# Patient Record
Sex: Female | Born: 1985 | Race: Black or African American | Hispanic: No | Marital: Single | Smoking: Former smoker
Health system: Southern US, Community
[De-identification: ages and names within clinical notes are randomized; demographics above are authoritative.]

## PROBLEM LIST (undated history)

## (undated) DIAGNOSIS — C50919 Malignant neoplasm of unspecified site of unspecified female breast: Secondary | ICD-10-CM

## (undated) DIAGNOSIS — G43909 Migraine, unspecified, not intractable, without status migrainosus: Secondary | ICD-10-CM

---

## 2010-06-09 ENCOUNTER — Emergency Department (HOSPITAL_COMMUNITY): Admission: EM | Admit: 2010-06-09 | Discharge: 2010-06-09 | Payer: Self-pay | Admitting: Emergency Medicine

## 2010-06-18 ENCOUNTER — Emergency Department (HOSPITAL_COMMUNITY): Admission: EM | Admit: 2010-06-18 | Discharge: 2010-06-18 | Payer: Self-pay | Admitting: Emergency Medicine

## 2010-06-21 ENCOUNTER — Emergency Department (HOSPITAL_COMMUNITY): Admission: EM | Admit: 2010-06-21 | Discharge: 2010-06-21 | Payer: Self-pay | Admitting: Emergency Medicine

## 2010-12-17 ENCOUNTER — Emergency Department (HOSPITAL_COMMUNITY)
Admission: EM | Admit: 2010-12-17 | Discharge: 2010-12-17 | Disposition: A | Discharge status: Home / Self Care | Payer: Self-pay | Attending: Emergency Medicine | Admitting: Emergency Medicine

## 2010-12-17 DIAGNOSIS — M545 Low back pain, unspecified: Secondary | ICD-10-CM | POA: Insufficient documentation

## 2010-12-17 DIAGNOSIS — N390 Urinary tract infection, site not specified: Secondary | ICD-10-CM | POA: Insufficient documentation

## 2010-12-17 DIAGNOSIS — N72 Inflammatory disease of cervix uteri: Secondary | ICD-10-CM | POA: Insufficient documentation

## 2010-12-17 DIAGNOSIS — Z853 Personal history of malignant neoplasm of breast: Secondary | ICD-10-CM | POA: Insufficient documentation

## 2010-12-17 DIAGNOSIS — R109 Unspecified abdominal pain: Secondary | ICD-10-CM | POA: Insufficient documentation

## 2010-12-17 DIAGNOSIS — N898 Other specified noninflammatory disorders of vagina: Secondary | ICD-10-CM | POA: Insufficient documentation

## 2010-12-17 DIAGNOSIS — A5901 Trichomonal vulvovaginitis: Secondary | ICD-10-CM | POA: Insufficient documentation

## 2010-12-17 LAB — WET PREP, GENITAL: Clue Cells Wet Prep HPF POC: NONE SEEN

## 2010-12-17 LAB — URINE MICROSCOPIC-ADD ON

## 2010-12-17 LAB — URINALYSIS, ROUTINE W REFLEX MICROSCOPIC
Bilirubin Urine: NEGATIVE
Glucose, UA: NEGATIVE mg/dL
Ketones, ur: NEGATIVE mg/dL
Protein, ur: NEGATIVE mg/dL

## 2013-06-03 ENCOUNTER — Encounter (HOSPITAL_COMMUNITY): Payer: Self-pay | Admitting: *Deleted

## 2013-06-03 ENCOUNTER — Emergency Department (HOSPITAL_COMMUNITY)
Admission: EM | Admit: 2013-06-03 | Discharge: 2013-06-03 | Disposition: A | Discharge status: Home / Self Care | Payer: Self-pay | Attending: Emergency Medicine | Admitting: Emergency Medicine

## 2013-06-03 DIAGNOSIS — R197 Diarrhea, unspecified: Secondary | ICD-10-CM | POA: Insufficient documentation

## 2013-06-03 DIAGNOSIS — F17200 Nicotine dependence, unspecified, uncomplicated: Secondary | ICD-10-CM | POA: Insufficient documentation

## 2013-06-03 DIAGNOSIS — N739 Female pelvic inflammatory disease, unspecified: Secondary | ICD-10-CM | POA: Insufficient documentation

## 2013-06-03 DIAGNOSIS — Z853 Personal history of malignant neoplasm of breast: Secondary | ICD-10-CM | POA: Insufficient documentation

## 2013-06-03 DIAGNOSIS — E869 Volume depletion, unspecified: Secondary | ICD-10-CM | POA: Insufficient documentation

## 2013-06-03 DIAGNOSIS — Z79899 Other long term (current) drug therapy: Secondary | ICD-10-CM | POA: Insufficient documentation

## 2013-06-03 DIAGNOSIS — N390 Urinary tract infection, site not specified: Secondary | ICD-10-CM | POA: Insufficient documentation

## 2013-06-03 DIAGNOSIS — Z3202 Encounter for pregnancy test, result negative: Secondary | ICD-10-CM | POA: Insufficient documentation

## 2013-06-03 DIAGNOSIS — R6883 Chills (without fever): Secondary | ICD-10-CM | POA: Insufficient documentation

## 2013-06-03 DIAGNOSIS — A599 Trichomoniasis, unspecified: Secondary | ICD-10-CM | POA: Insufficient documentation

## 2013-06-03 DIAGNOSIS — R112 Nausea with vomiting, unspecified: Secondary | ICD-10-CM | POA: Insufficient documentation

## 2013-06-03 HISTORY — DX: Malignant neoplasm of unspecified site of unspecified female breast: C50.919

## 2013-06-03 LAB — POCT PREGNANCY, URINE: Preg Test, Ur: NEGATIVE

## 2013-06-03 LAB — CBC WITH DIFFERENTIAL/PLATELET
Basophils Relative: 0 % (ref 0–1)
HCT: 41.1 % (ref 36.0–46.0)
Hemoglobin: 15 g/dL (ref 12.0–15.0)
Lymphs Abs: 2.8 10*3/uL (ref 0.7–4.0)
MCHC: 36.5 g/dL — ABNORMAL HIGH (ref 30.0–36.0)
Monocytes Absolute: 0.7 10*3/uL (ref 0.1–1.0)
Monocytes Relative: 10 % (ref 3–12)
Neutro Abs: 3.1 10*3/uL (ref 1.7–7.7)
RBC: 4.37 MIL/uL (ref 3.87–5.11)

## 2013-06-03 LAB — LIPASE, BLOOD: Lipase: 41 U/L (ref 11–59)

## 2013-06-03 LAB — COMPREHENSIVE METABOLIC PANEL
Albumin: 4.7 g/dL (ref 3.5–5.2)
BUN: 8 mg/dL (ref 6–23)
CO2: 24 mEq/L (ref 19–32)
Chloride: 97 mEq/L (ref 96–112)
Creatinine, Ser: 0.72 mg/dL (ref 0.50–1.10)
GFR calc Af Amer: >90 mL/min (ref >90)
GFR calc non Af Amer: >90 mL/min (ref >90)
Glucose, Bld: 92 mg/dL (ref 70–99)
Total Bilirubin: 0.2 mg/dL — ABNORMAL LOW (ref 0.3–1.2)

## 2013-06-03 LAB — URINE MICROSCOPIC-ADD ON

## 2013-06-03 LAB — URINALYSIS, ROUTINE W REFLEX MICROSCOPIC
Bilirubin Urine: NEGATIVE
Glucose, UA: NEGATIVE mg/dL
Ketones, ur: NEGATIVE mg/dL
pH: 5.5 (ref 5.0–8.0)

## 2013-06-03 LAB — GC/CHLAMYDIA PROBE AMP: GC Probe RNA: NEGATIVE

## 2013-06-03 MED ORDER — LIDOCAINE HCL (PF) 1 % IJ SOLN
INTRAMUSCULAR | Status: AC
  Administered 2013-06-03: 2.1 mL
  Filled 2013-06-03: qty 5

## 2013-06-03 MED ORDER — CEFTRIAXONE SODIUM 1 G IJ SOLR
1.0000 g | Freq: Once | INTRAMUSCULAR | Status: AC
  Administered 2013-06-03: 1 g via INTRAMUSCULAR
  Filled 2013-06-03: qty 10

## 2013-06-03 MED ORDER — AZITHROMYCIN 1 G PO PACK: PACK | ORAL | Status: DC

## 2013-06-03 MED ORDER — AZITHROMYCIN 1 G PO PACK
1.0000 g | PACK | Freq: Once | ORAL | Status: AC
  Administered 2013-06-03: 1 g via ORAL
  Filled 2013-06-03: qty 1

## 2013-06-03 MED ORDER — FENTANYL CITRATE 0.05 MG/ML IJ SOLN
100.0000 ug | Freq: Once | INTRAMUSCULAR | Status: AC
  Administered 2013-06-03: 100 ug via INTRAVENOUS
  Filled 2013-06-03: qty 2

## 2013-06-03 MED ORDER — SODIUM CHLORIDE 0.9 % IV BOLUS (SEPSIS)
1000.0000 mL | Freq: Once | INTRAVENOUS | Status: AC
  Administered 2013-06-03: 1000 mL via INTRAVENOUS

## 2013-06-03 MED ORDER — METRONIDAZOLE 500 MG PO TABS
2000.0000 mg | ORAL_TABLET | Freq: Once | ORAL | Status: AC
  Administered 2013-06-03: 2000 mg via ORAL
  Filled 2013-06-03: qty 4

## 2013-06-03 MED ORDER — NITROFURANTOIN MONOHYD MACRO 100 MG PO CAPS: 100.0000 mg | ORAL_CAPSULE | Freq: Two times a day (BID) | ORAL | Status: DC

## 2013-06-03 MED ORDER — HYDROCODONE-ACETAMINOPHEN 5-325 MG PO TABS: 1.0000 | ORAL_TABLET | Freq: Four times a day (QID) | ORAL | Status: DC | PRN

## 2013-06-03 MED ORDER — ONDANSETRON HCL 4 MG/2ML IJ SOLN
4.0000 mg | Freq: Once | INTRAMUSCULAR | Status: AC
  Administered 2013-06-03: 4 mg via INTRAVENOUS
  Filled 2013-06-03: qty 2

## 2013-06-03 MED ORDER — ONDANSETRON 8 MG PO TBDP: 8.0000 mg | ORAL_TABLET | Freq: Three times a day (TID) | ORAL | Status: DC | PRN

## 2013-06-03 NOTE — ED Provider Notes (Addendum)
CSN: [id number]     Arrival date & time 06/03/13  0157 History   First MD Initiated Contact with Patient 06/03/13 0226     Chief Complaint  Patient presents with  . Abdominal Pain   (Consider location/radiation/quality/duration/timing/severity/associated sxs/prior Treatment) HPI This is a 27 year old female who has had nausea, vomiting and diarrhea for the past 5 days. The diarrhea is described as brown and watery but without blood or mucus. This has been accompanied by generalized abdominal pain which is of a burning nature. This pain is constant. The pain is worse with movement. She has not had a fever but has had chills. She denies urinary symptoms. She denies vaginal bleeding but does have a vaginal discharge. She states she missed her most recent menstrual period so could possibly be pregnant. Her abdomen is not distended. Her nausea and vomiting are made worse by attempting to eat.  Past Medical History  Diagnosis Date  . Breast cancer    History reviewed. No pertinent past surgical history. History reviewed. No pertinent family history. History  Substance Use Topics  . Smoking status: Current Every Day Smoker  . Smokeless tobacco: Not on file  . Alcohol Use: No   OB History   Grav Para Term Preterm Abortions TAB SAB Ect Mult Living                 Review of Systems  All other systems reviewed and are negative.    Allergies  Vancomycin  Home Medications   Current Outpatient Rx  Name  Route  Sig  Dispense  Refill  . fish oil-omega-3 fatty acids 1000 MG capsule   Oral   Take 1 g by mouth 2 (two) times daily.         . Multiple Vitamin (MULTIVITAMIN WITH MINERALS) TABS tablet   Oral   Take 1 tablet by mouth daily.          BP 121/80  Pulse 88  Temp(Src) 98.8 F (37.1 C) (Oral)  Resp 16  SpO2 100%  LMP 04/03/2013  Physical Exam General: Well-developed, well-nourished female in no acute distress; appearance consistent with age of record HENT:  normocephalic; atraumatic Eyes: pupils equal, round and reactive to light; extraocular muscles intact Neck: supple Heart: regular rate and rhythm; no murmurs, rubs or gallops Lungs: clear to auscultation bilaterally Abdomen: soft; nondistended; diffuse tenderness; no masses or hepatosplenomegaly; bowel sounds present GU: Bilateral CVA tenderness right greater than left; normal external genitalia; white vaginal discharge; no vaginal bleeding; cervical motion tenderness; bilateral adnexal tenderness Extremities: No deformity; full range of motion; pulses normal; no edema Neurologic: Awake, alert and oriented; motor function intact in all extremities and symmetric; no facial droop Skin: Warm and dry Psychiatric: Normal mood and affect    ED Course  Procedures (including critical care time)    MDM  Nursing notes and vitals signs, including pulse oximetry, reviewed.  Summary of this visit's results, reviewed by myself:  Labs:  Results for orders placed during the hospital encounter of 06/03/13 (from the past 24 hour(s))  URINALYSIS, ROUTINE W REFLEX MICROSCOPIC     Status: Abnormal   Collection Time    06/03/13  2:29 AM      Result Value Range   Color, Urine YELLOW  YELLOW   APPearance TURBID (*) CLEAR   Specific Gravity, Urine 1.038 (*) 1.005 - 1.030   pH 5.5  5.0 - 8.0   Glucose, UA NEGATIVE  NEGATIVE mg/dL   Hgb urine dipstick MODERATE (*)  NEGATIVE   Bilirubin Urine NEGATIVE  NEGATIVE   Ketones, ur NEGATIVE  NEGATIVE mg/dL   Protein, ur NEGATIVE  NEGATIVE mg/dL   Urobilinogen, UA 0.2  0.0 - 1.0 mg/dL   Nitrite NEGATIVE  NEGATIVE   Leukocytes, UA LARGE (*) NEGATIVE  URINE MICROSCOPIC-ADD ON     Status: Abnormal   Collection Time    06/03/13  2:29 AM      Result Value Range   Squamous Epithelial / LPF MANY (*) RARE   WBC, UA 21-50  <3 WBC/hpf   RBC / HPF 7-10  <3 RBC/hpf   Bacteria, UA MANY (*) RARE   Urine-Other MUCOUS PRESENT    CBC WITH DIFFERENTIAL     Status:  Abnormal   Collection Time    06/03/13  2:32 AM      Result Value Range   WBC 6.8  4.0 - 10.5 K/uL   RBC 4.37  3.87 - 5.11 MIL/uL   Hemoglobin 15.0  12.0 - 15.0 g/dL   HCT 04.5  40.9 - 81.1 %   MCV 94.1  78.0 - 100.0 fL   MCH 34.3 (*) 26.0 - 34.0 pg   MCHC 36.5 (*) 30.0 - 36.0 g/dL   RDW 91.4  78.2 - 95.6 %   Platelets 241  150 - 400 K/uL   Neutrophils Relative % 46  43 - 77 %   Neutro Abs 3.1  1.7 - 7.7 K/uL   Lymphocytes Relative 41  12 - 46 %   Lymphs Abs 2.8  0.7 - 4.0 K/uL   Monocytes Relative 10  3 - 12 %   Monocytes Absolute 0.7  0.1 - 1.0 K/uL   Eosinophils Relative 2  0 - 5 %   Eosinophils Absolute 0.1  0.0 - 0.7 K/uL   Basophils Relative 0  0 - 1 %   Basophils Absolute 0.0  0.0 - 0.1 K/uL  COMPREHENSIVE METABOLIC PANEL     Status: Abnormal   Collection Time    06/03/13  2:32 AM      Result Value Range   Sodium 136  135 - 145 mEq/L   Potassium 3.4 (*) 3.5 - 5.1 mEq/L   Chloride 97  96 - 112 mEq/L   CO2 24  19 - 32 mEq/L   Glucose, Bld 92  70 - 99 mg/dL   BUN 8  6 - 23 mg/dL   Creatinine, Ser 2.13  0.50 - 1.10 mg/dL   Calcium 9.4  8.4 - 08.6 mg/dL   Total Protein 9.2 (*) 6.0 - 8.3 g/dL   Albumin 4.7  3.5 - 5.2 g/dL   AST 16  0 - 37 U/L   ALT 10  0 - 35 U/L   Alkaline Phosphatase 99  39 - 117 U/L   Total Bilirubin 0.2 (*) 0.3 - 1.2 mg/dL   GFR calc non Af Amer >90  >90 mL/min   GFR calc Af Amer >90  >90 mL/min  LIPASE, BLOOD     Status: None   Collection Time    06/03/13  2:32 AM      Result Value Range   Lipase 41  11 - 59 U/L  POCT PREGNANCY, URINE     Status: None   Collection Time    06/03/13  2:39 AM      Result Value Range   Preg Test, Ur NEGATIVE  NEGATIVE  WET PREP, GENITAL     Status: Abnormal   Collection Time  06/03/13  3:45 AM      Result Value Range   Yeast Wet Prep HPF POC NONE SEEN  NONE SEEN   Trich, Wet Prep MANY (*) NONE SEEN   Clue Cells Wet Prep HPF POC NONE SEEN  NONE SEEN   WBC, Wet Prep HPF POC FEW (*) NONE SEEN    5:07  AM Patient given 1 g of Rocephin IM to start treatment on her UTI and PID. We will also treat her with 2 doses of oral Zithromax and 2 g of oral Flagyl for her trichomoniasis.  Ultrasound is running behind this morning. We will arrange for the patient have a pelvic ultrasound as an outpatient.  She was advised to return in 2 days if she is not better or sooner if her symptoms worsen.     Hanley Seamen, MD 06/03/13 0507  Hanley Seamen, MD 06/03/13 1610  Hanley Seamen, MD 06/03/13 [phone]

## 2013-06-03 NOTE — ED Notes (Addendum)
Pt states that she started with generlaized abdominal pain 5 days ago. Pt has been vomiting since with diarrhea for the past 3 days. Pt states that she is unable to keep anything down. Pt states period has not happened for 2 months. Pt states she is usually regular with her periods.

## 2013-06-03 NOTE — ED Notes (Signed)
Dr. Molpus at bedside. 

## 2013-06-04 LAB — URINE CULTURE

## 2014-02-15 ENCOUNTER — Encounter (HOSPITAL_COMMUNITY): Payer: Self-pay | Admitting: Emergency Medicine

## 2014-02-15 ENCOUNTER — Emergency Department (HOSPITAL_COMMUNITY)
Admission: EM | Admit: 2014-02-15 | Discharge: 2014-02-16 | Disposition: A | Discharge status: Home / Self Care | Payer: Self-pay | Attending: Emergency Medicine | Admitting: Emergency Medicine

## 2014-02-15 ENCOUNTER — Emergency Department (HOSPITAL_COMMUNITY): Payer: Self-pay

## 2014-02-15 DIAGNOSIS — R519 Headache, unspecified: Secondary | ICD-10-CM

## 2014-02-15 DIAGNOSIS — R51 Headache: Secondary | ICD-10-CM

## 2014-02-15 DIAGNOSIS — C50919 Malignant neoplasm of unspecified site of unspecified female breast: Secondary | ICD-10-CM | POA: Insufficient documentation

## 2014-02-15 DIAGNOSIS — F17200 Nicotine dependence, unspecified, uncomplicated: Secondary | ICD-10-CM | POA: Insufficient documentation

## 2014-02-15 DIAGNOSIS — Z880 Allergy status to penicillin: Secondary | ICD-10-CM | POA: Insufficient documentation

## 2014-02-15 DIAGNOSIS — G43109 Migraine with aura, not intractable, without status migrainosus: Secondary | ICD-10-CM | POA: Insufficient documentation

## 2014-02-15 MED ORDER — PROCHLORPERAZINE EDISYLATE 5 MG/ML IJ SOLN
10.0000 mg | Freq: Once | INTRAMUSCULAR | Status: AC
  Administered 2014-02-15: 10 mg via INTRAVENOUS
  Filled 2014-02-15: qty 2

## 2014-02-15 MED ORDER — SODIUM CHLORIDE 0.9 % IV BOLUS (SEPSIS)
1000.0000 mL | Freq: Once | INTRAVENOUS | Status: AC
  Administered 2014-02-15: 1000 mL via INTRAVENOUS

## 2014-02-15 MED ORDER — METHYLPREDNISOLONE SODIUM SUCC 125 MG IJ SOLR
125.0000 mg | Freq: Once | INTRAMUSCULAR | Status: AC
  Administered 2014-02-15: 125 mg via INTRAVENOUS
  Filled 2014-02-15: qty 2

## 2014-02-15 MED ORDER — DIPHENHYDRAMINE HCL 50 MG/ML IJ SOLN
50.0000 mg | Freq: Once | INTRAMUSCULAR | Status: AC
  Administered 2014-02-15: 50 mg via INTRAVENOUS
  Filled 2014-02-15: qty 1

## 2014-02-15 NOTE — ED Notes (Signed)
The pt is c/o a headache for 2 days with nv  Diarrhea crying in triage.lmp 2 weeks ago.  Hs of migraine headaches

## 2014-02-15 NOTE — ED Notes (Signed)
C/o HA, pinpoints to mid & bilateral forehead, also reports nausea, and some vision changes, h/o same, last HA 2 weeks ago, usually comes and goes in a day, this has lasted for 2d, LMP 2 weeks ago, reports "lots of stress recently", tried BC powder ~ 40 minutes ago, last ate 30 minutes ago, alert, NAD, calm, interactive, ambulatory steady gait, dressed self into gown. No know triggers.

## 2014-02-15 NOTE — ED Notes (Signed)
Dr. Walden in to see pt.  

## 2014-02-15 NOTE — ED Notes (Signed)
Dr. Dayna Barker EDP into room

## 2014-02-15 NOTE — ED Provider Notes (Signed)
CSN: [id number]     Arrival date & time 02/15/14  2123 History   First MD Initiated Contact with Patient 02/15/14 2132     Chief Complaint  Patient presents with  . Migraine     (Consider location/radiation/quality/duration/timing/severity/associated sxs/prior Treatment) Patient is a 28 y.o. female presenting with headaches.  Headache Pain location:  Generalized Quality:  Dull Severity currently:  10/10 Severity at highest:  10/10 Onset quality:  Gradual Duration:  2 days Timing:  Constant Progression:  Worsening Chronicity:  Recurrent Similar to prior headaches: yes   Context: bright light   Context: not activity, not loud noise and not straining   Relieved by:  None tried Worsened by:  Nothing tried Ineffective treatments:  None tried Associated symptoms: nausea and vomiting   Associated symptoms: no abdominal pain, no back pain, no congestion, no cough, no dizziness, no fever, no loss of balance, no photophobia and no tingling     Past Medical History  Diagnosis Date  . Breast cancer    History reviewed. No pertinent past surgical history. No family history on file. History  Substance Use Topics  . Smoking status: Current Every Day Smoker  . Smokeless tobacco: Not on file  . Alcohol Use: No   OB History   Grav Para Term Preterm Abortions TAB SAB Ect Mult Living                 Review of Systems  Constitutional: Negative for fever and activity change.  HENT: Negative for congestion and facial swelling.   Eyes: Positive for visual disturbance. Negative for photophobia, discharge and redness.  Respiratory: Negative for cough and shortness of breath.   Cardiovascular: Negative for chest pain and palpitations.  Gastrointestinal: Positive for nausea and vomiting. Negative for abdominal pain and abdominal distention.  Endocrine: Negative for polydipsia and polyuria.  Genitourinary: Negative for dysuria and menstrual problem.  Musculoskeletal: Negative for back  pain and joint swelling.  Skin: Negative for color change and wound.  Neurological: Positive for headaches. Negative for dizziness, light-headedness and loss of balance.      Allergies  Vancomycin  Home Medications   Prior to Admission medications   Medication Sig Start Date End Date Taking? Authorizing Provider  fish oil-omega-3 fatty acids 1000 MG capsule Take 1 g by mouth 2 (two) times daily.   Yes Historical Provider, MD   BP 105/71  Pulse 63  Temp(Src) 97.8 F (36.6 C) (Oral)  Resp 20  Ht 5\' 5"  (1.651 m)  Wt 140 lb 3 oz (63.589 kg)  BMI 23.33 kg/m2  SpO2 99%  LMP 02/01/2014 Physical Exam  Nursing note and vitals reviewed. Constitutional: She is oriented to person, place, and time. She appears well-developed and well-nourished.  HENT:  Head: Normocephalic and atraumatic.  Eyes: Conjunctivae and EOM are normal. Right eye exhibits no discharge. Left eye exhibits no discharge.  Cardiovascular: Normal rate and regular rhythm.   Pulmonary/Chest: Effort normal and breath sounds normal. No respiratory distress.  Abdominal: Soft. She exhibits no distension. There is no tenderness. There is no rebound.  Musculoskeletal: Normal range of motion. She exhibits no edema and no tenderness.  Neurological: She is alert and oriented to person, place, and time.  No altered mental status, able to give full seemingly accurate history.  Face is symmetric, EOM's intact, pupils equal and reactive, vision intact, tongue and uvula midline without deviation Upper and Lower extremity motor 5/5, intact pain perception in distal extremities.    Skin: Skin is  warm and dry.    ED Course  Procedures (including critical care time) Labs Review Labs Reviewed - No data to display  Imaging Review Ct Head Wo Contrast  02/15/2014   CLINICAL DATA:  Headache for 2 days. Nausea and vomiting. Diarrhea.  EXAM: CT HEAD WITHOUT CONTRAST  TECHNIQUE: Contiguous axial images were obtained from the base of the  skull through the vertex without intravenous contrast.  COMPARISON:  None.  FINDINGS: There is no evidence of acute infarction, mass lesion, or intra- or extra-axial hemorrhage on CT.  The posterior fossa, including the cerebellum, brainstem and fourth ventricle, is within normal limits. The third and lateral ventricles, and basal ganglia are unremarkable in appearance. The cerebral hemispheres are symmetric in appearance, with normal gray-white differentiation. No mass effect or midline shift is seen.  There is no evidence of fracture; visualized osseous structures are unremarkable in appearance. The visualized portions of the orbits are within normal limits. The paranasal sinuses and mastoid air cells are well-aerated. No significant soft tissue abnormalities are seen.  IMPRESSION: Unremarkable noncontrast CT of the head.   Electronically Signed   By: Garald Balding M.D.   On: 02/15/2014 23:01     EKG Interpretation None      MDM   Final diagnoses:  Acute nonintractable headache, unspecified headache type    28 yo F w/ 2 days of progressively worsening migraine w/ nausea and vomiting. Similar to previous. Diffuse pain around head. No neurologic symptoms aside from mild light headedness and blurry vision. No loss of vision. Patient in obvious distress 2/2 pain, crying during examination. H/O breast cancer however no neuro findings, unlikely metastatic but will check head CT to be sure. Without infectious symptoms, doubt meningitis or encephalitis. Without laterality doubt cavernous sinus thrombosis. Will treat as primary headache and if improving will d/c.   Headache gone. Head ct negative for mets. Patient will obtain fu with neurology.   Merrily Pew, MD 02/15/14 [phone]

## 2014-02-16 NOTE — ED Provider Notes (Signed)
I saw and evaluated the patient, reviewed the resident's note and I agree with the findings and plan.   EKG Interpretation None      Hx of migraines, here with similar migraine to prior. No fevers or neurologic signs. Feeling better after migraine cocktail. Stable for discharge.  Osvaldo Shipper, MD 02/16/14 0010

## 2014-04-22 ENCOUNTER — Encounter (HOSPITAL_COMMUNITY): Payer: Self-pay | Admitting: Emergency Medicine

## 2014-04-22 ENCOUNTER — Emergency Department (HOSPITAL_COMMUNITY)
Admission: EM | Admit: 2014-04-22 | Discharge: 2014-04-22 | Disposition: A | Discharge status: Home / Self Care | Payer: Self-pay | Attending: Family Medicine | Admitting: Family Medicine

## 2014-04-22 DIAGNOSIS — N760 Acute vaginitis: Secondary | ICD-10-CM | POA: Insufficient documentation

## 2014-04-22 DIAGNOSIS — Z3202 Encounter for pregnancy test, result negative: Secondary | ICD-10-CM | POA: Insufficient documentation

## 2014-04-22 DIAGNOSIS — L03113 Cellulitis of right upper limb: Secondary | ICD-10-CM

## 2014-04-22 DIAGNOSIS — A499 Bacterial infection, unspecified: Secondary | ICD-10-CM | POA: Insufficient documentation

## 2014-04-22 DIAGNOSIS — Z79899 Other long term (current) drug therapy: Secondary | ICD-10-CM | POA: Insufficient documentation

## 2014-04-22 DIAGNOSIS — B9689 Other specified bacterial agents as the cause of diseases classified elsewhere: Secondary | ICD-10-CM | POA: Insufficient documentation

## 2014-04-22 DIAGNOSIS — IMO0002 Reserved for concepts with insufficient information to code with codable children: Secondary | ICD-10-CM | POA: Insufficient documentation

## 2014-04-22 DIAGNOSIS — Z853 Personal history of malignant neoplasm of breast: Secondary | ICD-10-CM | POA: Insufficient documentation

## 2014-04-22 DIAGNOSIS — N390 Urinary tract infection, site not specified: Secondary | ICD-10-CM | POA: Insufficient documentation

## 2014-04-22 DIAGNOSIS — Z4801 Encounter for change or removal of surgical wound dressing: Secondary | ICD-10-CM | POA: Insufficient documentation

## 2014-04-22 DIAGNOSIS — F17200 Nicotine dependence, unspecified, uncomplicated: Secondary | ICD-10-CM | POA: Insufficient documentation

## 2014-04-22 LAB — CBC WITH DIFFERENTIAL/PLATELET
Basophils Absolute: 0 10*3/uL (ref 0.0–0.1)
Basophils Relative: 0 % (ref 0–1)
EOS ABS: 0.1 10*3/uL (ref 0.0–0.7)
EOS PCT: 1 % (ref 0–5)
HCT: 41.1 % (ref 36.0–46.0)
HEMOGLOBIN: 14.4 g/dL (ref 12.0–15.0)
Lymphocytes Relative: 40 % (ref 12–46)
Lymphs Abs: 2.9 10*3/uL (ref 0.7–4.0)
MCH: 34.2 pg — AB (ref 26.0–34.0)
MCHC: 35 g/dL (ref 30.0–36.0)
MCV: 97.6 fL (ref 78.0–100.0)
MONOS PCT: 9 % (ref 3–12)
Monocytes Absolute: 0.6 10*3/uL (ref 0.1–1.0)
Neutro Abs: 3.6 10*3/uL (ref 1.7–7.7)
Neutrophils Relative %: 50 % (ref 43–77)
Platelets: 227 10*3/uL (ref 150–400)
RBC: 4.21 MIL/uL (ref 3.87–5.11)
RDW: 12.1 % (ref 11.5–15.5)
WBC: 7.2 10*3/uL (ref 4.0–10.5)

## 2014-04-22 LAB — URINE MICROSCOPIC-ADD ON

## 2014-04-22 LAB — COMPREHENSIVE METABOLIC PANEL
ALBUMIN: 4.1 g/dL (ref 3.5–5.2)
ALK PHOS: 80 U/L (ref 39–117)
ALT: 13 U/L (ref 0–35)
AST: 18 U/L (ref 0–37)
Anion gap: 10 (ref 5–15)
BUN: 5 mg/dL — ABNORMAL LOW (ref 6–23)
CO2: 29 mEq/L (ref 19–32)
Calcium: 9.6 mg/dL (ref 8.4–10.5)
Chloride: 102 mEq/L (ref 96–112)
Creatinine, Ser: 0.84 mg/dL (ref 0.50–1.10)
GFR calc Af Amer: >90 mL/min (ref >90)
GFR calc non Af Amer: >90 mL/min (ref >90)
GLUCOSE: 93 mg/dL (ref 70–99)
POTASSIUM: 3.9 meq/L (ref 3.7–5.3)
SODIUM: 141 meq/L (ref 137–147)
TOTAL PROTEIN: 8.2 g/dL (ref 6.0–8.3)
Total Bilirubin: 0.4 mg/dL (ref 0.3–1.2)

## 2014-04-22 LAB — WET PREP, GENITAL
CLUE CELLS WET PREP: NONE SEEN
Trich, Wet Prep: NONE SEEN
WBC, Wet Prep HPF POC: NONE SEEN
Yeast Wet Prep HPF POC: NONE SEEN

## 2014-04-22 LAB — URINALYSIS, ROUTINE W REFLEX MICROSCOPIC
BILIRUBIN URINE: NEGATIVE
GLUCOSE, UA: NEGATIVE mg/dL
KETONES UR: NEGATIVE mg/dL
LEUKOCYTES UA: NEGATIVE
Nitrite: POSITIVE — AB
PH: 7 (ref 5.0–8.0)
Protein, ur: NEGATIVE mg/dL
Specific Gravity, Urine: 1.015 (ref 1.005–1.030)
Urobilinogen, UA: 1 mg/dL (ref 0.0–1.0)

## 2014-04-22 LAB — LIPASE, BLOOD: Lipase: 40 U/L (ref 11–59)

## 2014-04-22 LAB — POC URINE PREG, ED: Preg Test, Ur: NEGATIVE

## 2014-04-22 MED ORDER — FLUCONAZOLE 150 MG PO TABS: 150.0000 mg | ORAL_TABLET | Freq: Every day | ORAL | Status: DC

## 2014-04-22 MED ORDER — CEPHALEXIN 500 MG PO CAPS: 500.0000 mg | ORAL_CAPSULE | Freq: Three times a day (TID) | ORAL | Status: DC

## 2014-04-22 NOTE — ED Notes (Signed)
Pt verbalizes understanding of d/c instructions and denies any further needs at this time. 

## 2014-04-22 NOTE — ED Notes (Signed)
Pt also reports not being able to eat for 5 days states that she has lower abdominal pain and nausea.

## 2014-04-22 NOTE — ED Provider Notes (Signed)
CSN: [id number]     Arrival date & time 04/22/14  1442 History   First MD Initiated Contact with Patient 04/22/14 [place]     Chief Complaint  Patient presents with  . Wound Check  . Abdominal Pain     (Consider location/radiation/quality/duration/timing/severity/associated sxs/prior Treatment) HPI  R elbow abscess: bit by spider 3 wks ago. Drained puss and started to resolve but 3 days ago started to swell again. No drainage. Ttp. No fevers, CP, SOB. Ice made it worse.   Vaginal discharge: started 3 days ago. Uynchanged. Intercourse w/o condoms. Denies fevers, lower abd pain.   Urinary frequency and dysuria: started 3 days ago. Has not tried anything   Past Medical History  Diagnosis Date  . Breast cancer    History reviewed. No pertinent past surgical history. No family history on file. History  Substance Use Topics  . Smoking status: Current Every Day Smoker -- 0.25 packs/day    Types: Cigarettes  . Smokeless tobacco: Not on file  . Alcohol Use: No   OB History   Grav Para Term Preterm Abortions TAB SAB Ect Mult Living                 Review of Systems  Constitutional: Negative for fever and chills.  Respiratory: Negative for chest tightness.   Genitourinary: Positive for dysuria, urgency, frequency, vaginal discharge and vaginal pain.  Musculoskeletal: Positive for arthralgias. Negative for joint swelling.  All other systems reviewed and are negative.     Allergies  Vancomycin  Home Medications   Prior to Admission medications   Medication Sig Start Date End Date Taking? Authorizing Provider  Ascorbic Acid (VITAMIN C PO) Take 1 tablet by mouth daily.   Yes Historical Provider, MD  fish oil-omega-3 fatty acids 1000 MG capsule Take 1 g by mouth 2 (two) times daily.   Yes Historical Provider, MD  cephALEXin (KEFLEX) 500 MG capsule Take 1 capsule (500 mg total) by mouth 3 (three) times daily. 04/22/14   Waldemar Dickens, MD  fluconazole (DIFLUCAN) 150 MG tablet  Take 1 tablet (150 mg total) by mouth daily. Repeat dose in 3 days 04/22/14   Waldemar Dickens, MD   BP 111/80  Pulse 77  Temp(Src) 98.8 F (37.1 C) (Oral)  Resp 20  SpO2 99%  LMP 02/20/2014 Physical Exam  Constitutional: She is oriented to person, place, and time. She appears well-developed and well-nourished.  HENT:  Head: Normocephalic and atraumatic.  Eyes: EOM are normal. Pupils are equal, round, and reactive to light.  Neck: Normal range of motion.  Cardiovascular: Normal rate, normal heart sounds and intact distal pulses.   No murmur heard. Pulmonary/Chest: Effort normal and breath sounds normal. No respiratory distress.  Abdominal: Soft.  Suprapubic ttp  Genitourinary:  Copious discharge no cervical motion ttp. No lesions  Musculoskeletal: Normal range of motion. She exhibits no edema.  Neurological: She is alert and oriented to person, place, and time. No cranial nerve deficit.  Skin: Skin is warm.  R elb ow w/ firm scabbed lesion w/o significant induration. Erythema present. No discharge. Ttp. No flucutance  Psychiatric: She has a normal mood and affect. Her behavior is normal. Judgment and thought content normal.    ED Course  Procedures (including critical care time) Labs Review Labs Reviewed  COMPREHENSIVE METABOLIC PANEL - Abnormal; Notable for the following:    BUN 5 (*)    All other components within normal limits  CBC WITH DIFFERENTIAL - Abnormal; Notable for  the following:    MCH 34.2 (*)    All other components within normal limits  URINALYSIS, ROUTINE W REFLEX MICROSCOPIC - Abnormal; Notable for the following:    APPearance CLOUDY (*)    Hgb urine dipstick TRACE (*)    Nitrite POSITIVE (*)    All other components within normal limits  URINE MICROSCOPIC-ADD ON - Abnormal; Notable for the following:    Squamous Epithelial / LPF MANY (*)    Bacteria, UA MANY (*)    All other components within normal limits  WET PREP, GENITAL  GC/CHLAMYDIA PROBE AMP   LIPASE, BLOOD  POC URINE PREG, ED    Imaging Review No results found.   EKG Interpretation None      MDM   Final diagnoses:  Cellulitis of right upper extremity  Vaginitis  UTI (lower urinary tract infection)    Start Keflex as this will treat both the cellulitis and uti Lab results will be reviewed adn the appropriate medications called in for the wet prep and other labs Diflucan for yeast infection (occurs frequently after ABX per pt.) Precautions given and all questions answered Linna Darner, MD Family Medicine 04/22/2014, 6:53 PM      Waldemar Dickens, MD 04/22/14 [phone]

## 2014-04-22 NOTE — Discharge Instructions (Signed)
You have a urinary tract infection Your elbow is also infected but there is no evidence of an abscess Please start the antibiotics Please start the diflucan for a yeast infection We willcall you with the results  Urinary Tract Infection Urinary tract infections (UTIs) can develop anywhere along your urinary tract. Your urinary tract is your body's drainage system for removing wastes and extra water. Your urinary tract includes two kidneys, two ureters, a bladder, and a urethra. Your kidneys are a pair of bean-shaped organs. Each kidney is about the size of your fist. They are located below your ribs, one on each side of your spine. CAUSES Infections are caused by microbes, which are microscopic organisms, including fungi, viruses, and bacteria. These organisms are so small that they can only be seen through a microscope. Bacteria are the microbes that most commonly cause UTIs. SYMPTOMS  Symptoms of UTIs may vary by age and gender of the patient and by the location of the infection. Symptoms in young women typically include a frequent and intense urge to urinate and a painful, burning feeling in the bladder or urethra during urination. Older women and men are more likely to be tired, shaky, and weak and have muscle aches and abdominal pain. A fever may mean the infection is in your kidneys. Other symptoms of a kidney infection include pain in your back or sides below the ribs, nausea, and vomiting. DIAGNOSIS To diagnose a UTI, your caregiver will ask you about your symptoms. Your caregiver also will ask to provide a urine sample. The urine sample will be tested for bacteria and white blood cells. White blood cells are made by your body to help fight infection. TREATMENT  Typically, UTIs can be treated with medication. Because most UTIs are caused by a bacterial infection, they usually can be treated with the use of antibiotics. The choice of antibiotic and length of treatment depend on your symptoms  and the type of bacteria causing your infection. HOME CARE INSTRUCTIONS  If you were prescribed antibiotics, take them exactly as your caregiver instructs you. Finish the medication even if you feel better after you have only taken some of the medication.  Drink enough water and fluids to keep your urine clear or pale yellow.  Avoid caffeine, tea, and carbonated beverages. They tend to irritate your bladder.  Empty your bladder often. Avoid holding urine for long periods of time.  Empty your bladder before and after sexual intercourse.  After a bowel movement, women should cleanse from front to back. Use each tissue only once. SEEK MEDICAL CARE IF:   You have back pain.  You develop a fever.  Your symptoms do not begin to resolve within 3 days. SEEK IMMEDIATE MEDICAL CARE IF:   You have severe back pain or lower abdominal pain.  You develop chills.  You have nausea or vomiting.  You have continued burning or discomfort with urination. MAKE SURE YOU:   Understand these instructions.  Will watch your condition.  Will get help right away if you are not doing well or get worse. Document Released: 05/25/2005 Document Revised: 02/14/2012 Document Reviewed: 09/23/2011 [facility] Patient Information 2015 [place], [place]. This information is not intended to replace advice given to you by your health care provider. Make sure you discuss any questions you have with your health care provider.

## 2014-04-22 NOTE — ED Notes (Signed)
Pt states thaty she was bit by a spider approx 3 weeks ago. Pt states that the swelling decreased however now is returning. Pt states that area is tender to touch

## 2014-04-23 LAB — GC/CHLAMYDIA PROBE AMP
CT Probe RNA: NEGATIVE
GC Probe RNA: NEGATIVE

## 2014-09-15 ENCOUNTER — Emergency Department (HOSPITAL_COMMUNITY)
Admission: EM | Admit: 2014-09-15 | Discharge: 2014-09-15 | Disposition: A | Discharge status: Home / Self Care | Payer: Self-pay | Attending: Emergency Medicine | Admitting: Emergency Medicine

## 2014-09-15 ENCOUNTER — Encounter (HOSPITAL_COMMUNITY): Payer: Self-pay | Admitting: *Deleted

## 2014-09-15 DIAGNOSIS — Z853 Personal history of malignant neoplasm of breast: Secondary | ICD-10-CM | POA: Insufficient documentation

## 2014-09-15 DIAGNOSIS — F419 Anxiety disorder, unspecified: Secondary | ICD-10-CM | POA: Insufficient documentation

## 2014-09-15 DIAGNOSIS — N390 Urinary tract infection, site not specified: Secondary | ICD-10-CM | POA: Insufficient documentation

## 2014-09-15 DIAGNOSIS — R1084 Generalized abdominal pain: Secondary | ICD-10-CM

## 2014-09-15 DIAGNOSIS — Z720 Tobacco use: Secondary | ICD-10-CM | POA: Insufficient documentation

## 2014-09-15 DIAGNOSIS — Z79899 Other long term (current) drug therapy: Secondary | ICD-10-CM | POA: Insufficient documentation

## 2014-09-15 DIAGNOSIS — Z3202 Encounter for pregnancy test, result negative: Secondary | ICD-10-CM | POA: Insufficient documentation

## 2014-09-15 LAB — URINALYSIS, ROUTINE W REFLEX MICROSCOPIC
BILIRUBIN URINE: NEGATIVE
GLUCOSE, UA: NEGATIVE mg/dL
KETONES UR: NEGATIVE mg/dL
NITRITE: NEGATIVE
PH: 6 (ref 5.0–8.0)
PROTEIN: NEGATIVE mg/dL
Specific Gravity, Urine: 1.022 (ref 1.005–1.030)
UROBILINOGEN UA: 0.2 mg/dL (ref 0.0–1.0)

## 2014-09-15 LAB — URINE MICROSCOPIC-ADD ON

## 2014-09-15 LAB — PREGNANCY, URINE: Preg Test, Ur: NEGATIVE

## 2014-09-15 MED ORDER — SULFAMETHOXAZOLE-TRIMETHOPRIM 800-160 MG PO TABS
1.0000 | ORAL_TABLET | Freq: Two times a day (BID) | ORAL | Status: AC
Start: 2014-09-15 — End: 2014-09-20

## 2014-09-15 MED ORDER — SULFAMETHOXAZOLE-TRIMETHOPRIM 800-160 MG PO TABS: 1.0000 | ORAL_TABLET | Freq: Once | ORAL | Status: DC

## 2014-09-15 NOTE — ED Notes (Signed)
Pt from home with c/o generalized abd pain x8 days ago, denies any v/d but reports nausea. Pt denies any fevers at this time. Pt also reports lower bilateral back pain. Denies any vaginal odor or discharge.

## 2014-09-15 NOTE — ED Provider Notes (Signed)
CSN: [id number]     Arrival date & time 09/15/14  1540 History   First MD Initiated Contact with Patient 09/15/14 1711     Chief Complaint  Patient presents with  . Abdominal Pain     HPI  Patient presents with 8 days of abdominal pain.  No precipitant.  Since onset pain has been constant, diffuse, sore.  There is no concurrent vomiting, diarrhea, vaginal complaints, dysuria. There is no fever, chills. Patient has not taken any medication for her pain throughout the course of this illness. Patient is also having her daughter evaluated in the emergency room for a different complaint per She states that she generally well, was well prior to the onset of symptoms.  Past Medical History  Diagnosis Date  . Breast cancer    History reviewed. No pertinent past surgical history. No family history on file. History  Substance Use Topics  . Smoking status: Current Every Day Smoker -- 0.25 packs/day    Types: Cigarettes  . Smokeless tobacco: Not on file  . Alcohol Use: No   OB History    No data available     Review of Systems  Constitutional:       Per HPI, otherwise negative  HENT:       Per HPI, otherwise negative  Respiratory:       Per HPI, otherwise negative  Cardiovascular:       Per HPI, otherwise negative  Gastrointestinal: Positive for abdominal pain. Negative for vomiting.  Endocrine:       Negative aside from HPI  Genitourinary:       Neg aside from HPI   Musculoskeletal:       Per HPI, otherwise negative  Skin: Negative.   Neurological: Negative for syncope.      Allergies  Vancomycin  Home Medications   Prior to Admission medications   Medication Sig Start Date End Date Taking? Authorizing Provider  Ascorbic Acid (VITAMIN C PO) Take 1 tablet by mouth daily.   Yes Historical Provider, MD  fish oil-omega-3 fatty acids 1000 MG capsule Take 1 g by mouth 2 (two) times daily.   Yes Historical Provider, MD  cephALEXin (KEFLEX) 500 MG capsule Take 1 capsule  (500 mg total) by mouth 3 (three) times daily. Patient not taking: Reported on 09/15/2014 04/22/14   Waldemar Dickens, MD  fluconazole (DIFLUCAN) 150 MG tablet Take 1 tablet (150 mg total) by mouth daily. Repeat dose in 3 days Patient not taking: Reported on 09/15/2014 04/22/14   Waldemar Dickens, MD  sulfamethoxazole-trimethoprim (BACTRIM DS,SEPTRA DS) 800-160 MG per tablet Take 1 tablet by mouth 2 (two) times daily. 09/15/14 09/20/14  Carmin Muskrat, MD   BP 114/80 mmHg  Pulse 77  Temp(Src) 98.1 F (36.7 C) (Oral)  Resp 18  Wt 139 lb (63.05 kg)  SpO2 100%  LMP 08/15/2014 Physical Exam  Constitutional: She is oriented to person, place, and time. She appears well-developed and well-nourished. No distress.  HENT:  Head: Normocephalic and atraumatic.  Eyes: Conjunctivae and EOM are normal.  Cardiovascular: Normal rate and regular rhythm.   Pulmonary/Chest: Effort normal and breath sounds normal. No stridor. No respiratory distress.  Abdominal: She exhibits no distension.  Soft, non-peritoneal abdominal exam.  Musculoskeletal: She exhibits no edema.  Neurological: She is alert and oriented to person, place, and time. No cranial nerve deficit.  Skin: Skin is warm and dry.  Psychiatric: Her mood appears anxious.  Nursing note and vitals reviewed.   ED  Course  Procedures (including critical care time) Labs Review Labs Reviewed  URINALYSIS, ROUTINE W REFLEX MICROSCOPIC - Abnormal; Notable for the following:    APPearance CLOUDY (*)    Hgb urine dipstick TRACE (*)    Leukocytes, UA MODERATE (*)    All other components within normal limits  URINE MICROSCOPIC-ADD ON - Abnormal; Notable for the following:    Squamous Epithelial / LPF FEW (*)    Bacteria, UA MANY (*)    All other components within normal limits  PREGNANCY, URINE   Patient anxious for discharge on repeat exam MDM   Final diagnoses:  Generalized abdominal pain  UTI (lower urinary tract infection)    Well-appearing  female presents with 8 days of pain, no attempts at pain relief as an outpatient.  Patient has a soft, non-peritoneal abdomen, unremarkable vital signs, is clearly in no distress.  There is some evidence for urinary tract infection for which the patient was started on antibiotics, otherwise the patient's evaluation was reassuring.     Carmin Muskrat, MD 09/15/14 [phone]

## 2014-09-15 NOTE — Discharge Instructions (Signed)
As discussed, today's evaluation has resulted in a diagnosis of a urinary tract infection.  Your exam is otherwise reassuring.  However, it is important that you follow-up with your primary care physician.  Please call tomorrow to arrange appropriate ongoing care.   Abdominal Pain, Women Abdominal (stomach, pelvic, or belly) pain can be caused by many things. It is important to tell your doctor:  The location of the pain.  Does it come and go or is it present all the time?  Are there things that start the pain (eating certain foods, exercise)?  Are there other symptoms associated with the pain (fever, nausea, vomiting, diarrhea)? All of this is helpful to know when trying to find the cause of the pain. CAUSES   Stomach: virus or bacteria infection, or ulcer.  Intestine: appendicitis (inflamed appendix), regional ileitis (Crohn's disease), ulcerative colitis (inflamed colon), irritable bowel syndrome, diverticulitis (inflamed diverticulum of the colon), or cancer of the stomach or intestine.  Gallbladder disease or stones in the gallbladder.  Kidney disease, kidney stones, or infection.  Pancreas infection or cancer.  Fibromyalgia (pain disorder).  Diseases of the female organs:  Uterus: fibroid (non-cancerous) tumors or infection.  Fallopian tubes: infection or tubal pregnancy.  Ovary: cysts or tumors.  Pelvic adhesions (scar tissue).  Endometriosis (uterus lining tissue growing in the pelvis and on the pelvic organs).  Pelvic congestion syndrome (female organs filling up with blood just before the menstrual period).  Pain with the menstrual period.  Pain with ovulation (producing an egg).  Pain with an IUD (intrauterine device, birth control) in the uterus.  Cancer of the female organs.  Functional pain (pain not caused by a disease, may improve without treatment).  Psychological pain.  Depression. DIAGNOSIS  Your doctor will decide the seriousness of your  pain by doing an examination.  Blood tests.  X-rays.  Ultrasound.  CT scan (computed tomography, special type of X-ray).  MRI (magnetic resonance imaging).  Cultures, for infection.  Barium enema (dye inserted in the large intestine, to better view it with X-rays).  Colonoscopy (looking in intestine with a lighted tube).  Laparoscopy (minor surgery, looking in abdomen with a lighted tube).  Major abdominal exploratory surgery (looking in abdomen with a large incision). TREATMENT  The treatment will depend on the cause of the pain.   Many cases can be observed and treated at home.  Over-the-counter medicines recommended by your caregiver.  Prescription medicine.  Antibiotics, for infection.  Birth control pills, for painful periods or for ovulation pain.  Hormone treatment, for endometriosis.  Nerve blocking injections.  Physical therapy.  Antidepressants.  Counseling with a psychologist or psychiatrist.  Minor or major surgery. HOME CARE INSTRUCTIONS   Do not take laxatives, unless directed by your caregiver.  Take over-the-counter pain medicine only if ordered by your caregiver. Do not take aspirin because it can cause an upset stomach or bleeding.  Try a clear liquid diet (broth or water) as ordered by your caregiver. Slowly move to a bland diet, as tolerated, if the pain is related to the stomach or intestine.  Have a thermometer and take your temperature several times a day, and record it.  Bed rest and sleep, if it helps the pain.  Avoid sexual intercourse, if it causes pain.  Avoid stressful situations.  Keep your follow-up appointments and tests, as your caregiver orders.  If the pain does not go away with medicine or surgery, you may try:  Acupuncture.  Relaxation exercises (yoga, meditation).  Group therapy.  Counseling. SEEK MEDICAL CARE IF:   You notice certain foods cause stomach pain.  Your home care treatment is not helping  your pain.  You need stronger pain medicine.  You want your IUD removed.  You feel faint or lightheaded.  You develop nausea and vomiting.  You develop a rash.  You are having side effects or an allergy to your medicine. SEEK IMMEDIATE MEDICAL CARE IF:   Your pain does not go away or gets worse.  You have a fever.  Your pain is felt only in portions of the abdomen. The right side could possibly be appendicitis. The left lower portion of the abdomen could be colitis or diverticulitis.  You are passing blood in your stools (bright red or black tarry stools, with or without vomiting).  You have blood in your urine.  You develop chills, with or without a fever.  You pass out. MAKE SURE YOU:   Understand these instructions.  Will watch your condition.  Will get help right away if you are not doing well or get worse. Document Released: 06/12/2007 Document Revised: 12/30/2013 Document Reviewed: 07/02/2009 [facility] Patient Information 2015 [place], [place]. This information is not intended to replace advice given to you by your health care provider. Make sure you discuss any questions you have with your health care provider.

## 2014-09-15 NOTE — ED Notes (Signed)
Pt comes in c/o abd pain x 8 days. /o nausea. Denies v/d, fever and urinary symptoms. Last bm yesterday was normal. Sts she has breast cancer. Fish oil PTA. Pt alert, appropriate.

## 2014-09-15 NOTE — ED Notes (Signed)
Pt not in room. Staff states pt ambulated out with daughter.

## 2014-09-15 NOTE — ED Notes (Signed)
Pt at nurses station to ask how long it would take, this RN notified pt that we were still awaiting pregnancy urine results. Pt irritated.

## 2014-09-20 ENCOUNTER — Emergency Department (HOSPITAL_COMMUNITY)
Admission: EM | Admit: 2014-09-20 | Discharge: 2014-09-21 | Disposition: A | Discharge status: Home / Self Care | Payer: Self-pay | Attending: Emergency Medicine | Admitting: Emergency Medicine

## 2014-09-20 ENCOUNTER — Encounter (HOSPITAL_COMMUNITY): Payer: Self-pay | Admitting: Emergency Medicine

## 2014-09-20 DIAGNOSIS — Z79899 Other long term (current) drug therapy: Secondary | ICD-10-CM | POA: Insufficient documentation

## 2014-09-20 DIAGNOSIS — Z853 Personal history of malignant neoplasm of breast: Secondary | ICD-10-CM | POA: Insufficient documentation

## 2014-09-20 DIAGNOSIS — G43009 Migraine without aura, not intractable, without status migrainosus: Secondary | ICD-10-CM | POA: Insufficient documentation

## 2014-09-20 DIAGNOSIS — Z720 Tobacco use: Secondary | ICD-10-CM | POA: Insufficient documentation

## 2014-09-20 DIAGNOSIS — F1210 Cannabis abuse, uncomplicated: Secondary | ICD-10-CM | POA: Insufficient documentation

## 2014-09-20 NOTE — ED Notes (Signed)
Patient here with complaint of migraine, onset this am at 0900. States history of the same with similar presentation. Currently accompanied by nausea. New symptom tonight is hot flashes. Presents because pain is more severe that usual. Has Rx for migraine, unsure of type, and hasn't taken it today.

## 2014-09-21 MED ORDER — KETOROLAC TROMETHAMINE 60 MG/2ML IM SOLN
60.0000 mg | Freq: Once | INTRAMUSCULAR | Status: AC
  Administered 2014-09-21: 60 mg via INTRAMUSCULAR
  Filled 2014-09-21: qty 2

## 2014-09-21 MED ORDER — METOCLOPRAMIDE HCL 5 MG/ML IJ SOLN
10.0000 mg | Freq: Once | INTRAMUSCULAR | Status: AC
  Administered 2014-09-21: 10 mg via INTRAMUSCULAR
  Filled 2014-09-21: qty 2

## 2014-09-21 MED ORDER — DIPHENHYDRAMINE HCL 25 MG PO CAPS
25.0000 mg | ORAL_CAPSULE | Freq: Once | ORAL | Status: AC
  Administered 2014-09-21: 25 mg via ORAL
  Filled 2014-09-21: qty 1

## 2014-09-21 MED ORDER — DEXAMETHASONE SODIUM PHOSPHATE 10 MG/ML IJ SOLN
10.0000 mg | Freq: Once | INTRAMUSCULAR | Status: AC
  Administered 2014-09-21: 10 mg via INTRAMUSCULAR
  Filled 2014-09-21: qty 1

## 2014-09-21 NOTE — ED Provider Notes (Signed)
CSN: [id number]     Arrival date & time 09/20/14  2346 History   First MD Initiated Contact with Patient 09/20/14 2350     Chief Complaint  Patient presents with  . Migraine     (Consider location/radiation/quality/duration/timing/severity/associated sxs/prior Treatment) HPI Comments: 29 year old female with a history of migraines presenting to the ED complaining of sudden onset headache beginning at 9:00 AM today when she woke up from sleep. Headache described as throbbing all around her head. States this is similar to the migraines she's had in the past. Admits to associated nausea, photophobia and phonophobia. No vomiting, fever or neck pain. She has a prescription medication for migraines, however she does not know the name of it and did not take it today. Patient has a strong smell of marijuana, when asking about alcohol or drug use, patient denies, does admit to smoking cigarettes and marijuana, however states none today.  Patient is a 29 y.o. female presenting with migraines. The history is provided by the patient.  Migraine Associated symptoms include headaches and nausea.    Past Medical History  Diagnosis Date  . Breast cancer    History reviewed. No pertinent past surgical history. History reviewed. No pertinent family history. History  Substance Use Topics  . Smoking status: Current Every Day Smoker -- 0.25 packs/day    Types: Cigarettes  . Smokeless tobacco: Not on file  . Alcohol Use: No   OB History    No data available     Review of Systems  Eyes: Positive for photophobia.  Gastrointestinal: Positive for nausea.  Neurological: Positive for headaches.  All other systems reviewed and are negative.     Allergies  Vancomycin  Home Medications   Prior to Admission medications   Medication Sig Start Date End Date Taking? Authorizing Provider  Ascorbic Acid (VITAMIN C PO) Take 1 tablet by mouth daily.   Yes Historical Provider, MD  fish oil-omega-3 fatty  acids 1000 MG capsule Take 1 g by mouth 2 (two) times daily.   Yes Historical Provider, MD  cephALEXin (KEFLEX) 500 MG capsule Take 1 capsule (500 mg total) by mouth 3 (three) times daily. Patient not taking: Reported on 09/15/2014 04/22/14   Waldemar Dickens, MD  fluconazole (DIFLUCAN) 150 MG tablet Take 1 tablet (150 mg total) by mouth daily. Repeat dose in 3 days Patient not taking: Reported on 09/15/2014 04/22/14   Waldemar Dickens, MD   BP 129/91 mmHg  Pulse 76  Temp(Src) 98 F (36.7 C) (Oral)  Resp 20  Ht 5\' 5"  (1.651 m)  Wt 140 lb (63.504 kg)  BMI 23.30 kg/m2  SpO2 100%  LMP 08/15/2014 Physical Exam  Constitutional: She is oriented to person, place, and time. She appears well-developed and well-nourished. No distress.  Strong smell of marijunana.  HENT:  Head: Normocephalic and atraumatic.  Mouth/Throat: Oropharynx is clear and moist.  Eyes: Conjunctivae and EOM are normal. Pupils are equal, round, and reactive to light.  Neck: Normal range of motion. Neck supple.  No meningeal signs.  Cardiovascular: Normal rate, regular rhythm, normal heart sounds and intact distal pulses.   Pulmonary/Chest: Effort normal and breath sounds normal. No respiratory distress.  Abdominal: Soft. Bowel sounds are normal. There is no tenderness.  Musculoskeletal: Normal range of motion. She exhibits no edema.  Neurological: She is alert and oriented to person, place, and time. She has normal strength. No cranial nerve deficit or sensory deficit. Coordination and gait normal.  Speech fluent, goal oriented. Moves  limbs without ataxia. Equal grip strength bilateral.  Skin: Skin is warm and dry. No rash noted. She is not diaphoretic.  Psychiatric: Her affect is blunt. She is agitated.  Nursing note and vitals reviewed.   ED Course  Procedures (including critical care time) Labs Review Labs Reviewed - No data to display  Imaging Review No results found.   EKG Interpretation None      MDM    Final diagnoses:  Migraine without aura and without status migrainosus, not intractable   Patient in no apparent distress. Afebrile, vital signs stable. No red flags concerning patient's headache. Headache similar to prior migraines. No thunder clap appearance. No meningeal signs. Doubt SAH, ICH, meningitis or CVA. No focal neurologic deficits. Headache improved after receiving Toradol, Decadron, Benadryl and Reglan. Stable for discharge. Return precautions given. Patient states understanding of treatment care plan and is agreeable.  Carman Ching, PA-C 09/21/14 0240  Everlene Balls, MD 09/21/14 [phone]

## 2014-09-21 NOTE — Discharge Instructions (Signed)

## 2014-10-17 ENCOUNTER — Emergency Department (HOSPITAL_COMMUNITY): Payer: Medicaid - Out of State

## 2014-10-17 ENCOUNTER — Emergency Department (HOSPITAL_COMMUNITY)
Admission: EM | Admit: 2014-10-17 | Discharge: 2014-10-17 | Disposition: A | Discharge status: Home / Self Care | Payer: Medicaid - Out of State | Attending: Emergency Medicine | Admitting: Emergency Medicine

## 2014-10-17 ENCOUNTER — Encounter (HOSPITAL_COMMUNITY): Payer: Self-pay | Admitting: Emergency Medicine

## 2014-10-17 DIAGNOSIS — O99331 Smoking (tobacco) complicating pregnancy, first trimester: Secondary | ICD-10-CM | POA: Insufficient documentation

## 2014-10-17 DIAGNOSIS — O23511 Infections of cervix in pregnancy, first trimester: Secondary | ICD-10-CM | POA: Insufficient documentation

## 2014-10-17 DIAGNOSIS — Z853 Personal history of malignant neoplasm of breast: Secondary | ICD-10-CM | POA: Insufficient documentation

## 2014-10-17 DIAGNOSIS — F17210 Nicotine dependence, cigarettes, uncomplicated: Secondary | ICD-10-CM | POA: Diagnosis not present

## 2014-10-17 DIAGNOSIS — O210 Mild hyperemesis gravidarum: Secondary | ICD-10-CM | POA: Insufficient documentation

## 2014-10-17 DIAGNOSIS — O9989 Other specified diseases and conditions complicating pregnancy, childbirth and the puerperium: Secondary | ICD-10-CM | POA: Diagnosis present

## 2014-10-17 DIAGNOSIS — R109 Unspecified abdominal pain: Secondary | ICD-10-CM

## 2014-10-17 DIAGNOSIS — B9689 Other specified bacterial agents as the cause of diseases classified elsewhere: Secondary | ICD-10-CM

## 2014-10-17 DIAGNOSIS — Z3A01 Less than 8 weeks gestation of pregnancy: Secondary | ICD-10-CM | POA: Insufficient documentation

## 2014-10-17 DIAGNOSIS — N760 Acute vaginitis: Secondary | ICD-10-CM

## 2014-10-17 DIAGNOSIS — Z79899 Other long term (current) drug therapy: Secondary | ICD-10-CM | POA: Diagnosis not present

## 2014-10-17 DIAGNOSIS — Z8679 Personal history of other diseases of the circulatory system: Secondary | ICD-10-CM | POA: Insufficient documentation

## 2014-10-17 DIAGNOSIS — Z3490 Encounter for supervision of normal pregnancy, unspecified, unspecified trimester: Secondary | ICD-10-CM

## 2014-10-17 HISTORY — DX: Migraine, unspecified, not intractable, without status migrainosus: G43.909

## 2014-10-17 LAB — HCG, QUANTITATIVE, PREGNANCY: hCG, Beta Chain, Quant, S: 687 m[IU]/mL — ABNORMAL HIGH (ref <5)

## 2014-10-17 LAB — CBC WITH DIFFERENTIAL/PLATELET
Basophils Absolute: 0 10*3/uL (ref 0.0–0.1)
Basophils Relative: 0 % (ref 0–1)
Eosinophils Absolute: 0.1 10*3/uL (ref 0.0–0.7)
Eosinophils Relative: 2 % (ref 0–5)
HEMATOCRIT: 34.5 % — AB (ref 36.0–46.0)
Hemoglobin: 12.2 g/dL (ref 12.0–15.0)
LYMPHS ABS: 3.1 10*3/uL (ref 0.7–4.0)
LYMPHS PCT: 40 % (ref 12–46)
MCH: 33.1 pg (ref 26.0–34.0)
MCHC: 35.4 g/dL (ref 30.0–36.0)
MCV: 93.5 fL (ref 78.0–100.0)
Monocytes Absolute: 0.8 10*3/uL (ref 0.1–1.0)
Monocytes Relative: 10 % (ref 3–12)
Neutro Abs: 3.6 10*3/uL (ref 1.7–7.7)
Neutrophils Relative %: 48 % (ref 43–77)
PLATELETS: 252 10*3/uL (ref 150–400)
RBC: 3.69 MIL/uL — ABNORMAL LOW (ref 3.87–5.11)
RDW: 11.9 % (ref 11.5–15.5)
WBC: 7.6 10*3/uL (ref 4.0–10.5)

## 2014-10-17 LAB — URINALYSIS, ROUTINE W REFLEX MICROSCOPIC
Bilirubin Urine: NEGATIVE
Glucose, UA: NEGATIVE mg/dL
KETONES UR: NEGATIVE mg/dL
Nitrite: NEGATIVE
Protein, ur: NEGATIVE mg/dL
Specific Gravity, Urine: 1.023 (ref 1.005–1.030)
UROBILINOGEN UA: 1 mg/dL (ref 0.0–1.0)
pH: 5.5 (ref 5.0–8.0)

## 2014-10-17 LAB — COMPREHENSIVE METABOLIC PANEL
ALK PHOS: 60 U/L (ref 39–117)
ALT: 8 U/L (ref 0–35)
AST: 16 U/L (ref 0–37)
Albumin: 3.9 g/dL (ref 3.5–5.2)
Anion gap: 4 — ABNORMAL LOW (ref 5–15)
BUN: 10 mg/dL (ref 6–23)
CALCIUM: 8.7 mg/dL (ref 8.4–10.5)
CO2: 26 mmol/L (ref 19–32)
Chloride: 106 mmol/L (ref 96–112)
Creatinine, Ser: 0.86 mg/dL (ref 0.50–1.10)
GFR, EST NON AFRICAN AMERICAN: 90 mL/min — AB (ref >90)
GLUCOSE: 99 mg/dL (ref 70–99)
Potassium: 3.4 mmol/L — ABNORMAL LOW (ref 3.5–5.1)
Sodium: 136 mmol/L (ref 135–145)
Total Bilirubin: 0.5 mg/dL (ref 0.3–1.2)
Total Protein: 7.1 g/dL (ref 6.0–8.3)

## 2014-10-17 LAB — GC/CHLAMYDIA PROBE AMP (~~LOC~~) NOT AT ARMC
CHLAMYDIA, DNA PROBE: NEGATIVE
NEISSERIA GONORRHEA: NEGATIVE

## 2014-10-17 LAB — WET PREP, GENITAL
Trich, Wet Prep: NONE SEEN
Yeast Wet Prep HPF POC: NONE SEEN

## 2014-10-17 LAB — LIPASE, BLOOD: Lipase: 32 U/L (ref 11–59)

## 2014-10-17 LAB — URINE MICROSCOPIC-ADD ON

## 2014-10-17 LAB — POC URINE PREG, ED: Preg Test, Ur: POSITIVE — AB

## 2014-10-17 MED ORDER — METOCLOPRAMIDE HCL 10 MG PO TABS
10.0000 mg | ORAL_TABLET | Freq: Once | ORAL | Status: AC
  Administered 2014-10-17: 10 mg via ORAL
  Filled 2014-10-17: qty 1

## 2014-10-17 MED ORDER — METOCLOPRAMIDE HCL 5 MG/ML IJ SOLN
10.0000 mg | INTRAMUSCULAR | Status: DC
  Filled 2014-10-17: qty 2

## 2014-10-17 MED ORDER — METRONIDAZOLE 500 MG PO TABS: 500.0000 mg | ORAL_TABLET | Freq: Two times a day (BID) | ORAL | Status: DC

## 2014-10-17 MED ORDER — SODIUM CHLORIDE 0.9 % IV BOLUS (SEPSIS): 1000.0000 mL | Freq: Once | INTRAVENOUS | Status: DC

## 2014-10-17 NOTE — ED Provider Notes (Signed)
6:10 AM Patient received in hand off form PA Humes.   + Pregnancy. [place] negative for IUP. + BV Will treat the patient with flagyl.  The patient will be discharged with instructions to follow up within 48 hours at the [facility] for repeat HCG and OB US. Tylenol for pain.  The patient appears reasonably screened and/or stabilized for discharge and I doubt any other medical condition or other [facility] requiring further screening, evaluation, or treatment in the ED at this time prior to discharge.   Margarita Mail, PA-C 10/17/14 1537  Kalman Drape, MD 10/17/14 [phone]

## 2014-10-17 NOTE — Discharge Instructions (Signed)
Please follow up at the [facility] emergency department in 48 hours for repeat labs and [place]. Please take your medication as directed.  Bacterial Vaginosis Bacterial vaginosis is a vaginal infection that occurs when the normal balance of bacteria in the vagina is disrupted. It results from an overgrowth of certain bacteria. This is the most common vaginal infection in women of childbearing age. Treatment is important to prevent complications, especially in pregnant women, as it can cause a premature delivery. CAUSES  Bacterial vaginosis is caused by an increase in harmful bacteria that are normally present in smaller amounts in the vagina. Several different kinds of bacteria can cause bacterial vaginosis. However, the reason that the condition develops is not fully understood. RISK FACTORS Certain activities or behaviors can put you at an increased risk of developing bacterial vaginosis, including:  Having a new sex partner or multiple sex partners.  Douching.  Using an intrauterine device (IUD) for contraception. Women do not get bacterial vaginosis from toilet seats, bedding, swimming pools, or contact with objects around them. SIGNS AND SYMPTOMS  Some women with bacterial vaginosis have no signs or symptoms. Common symptoms include:  Grey vaginal discharge.  A fishlike odor with discharge, especially after sexual intercourse.  Itching or burning of the vagina and vulva.  Burning or pain with urination. DIAGNOSIS  Your health care provider will take a medical history and examine the vagina for signs of bacterial vaginosis. A sample of vaginal fluid may be taken. Your health care provider will look at this sample under a microscope to check for bacteria and abnormal cells. A vaginal pH test may also be done.  TREATMENT  Bacterial vaginosis may be treated with antibiotic medicines. These may be given in the form of a pill or a vaginal cream. A second round of antibiotics may be  prescribed if the condition comes back after treatment.  HOME CARE INSTRUCTIONS   Only take over-the-counter or prescription medicines as directed by your health care provider.  If antibiotic medicine was prescribed, take it as directed. Make sure you finish it even if you start to feel better.  Do not have sex until treatment is completed.  Tell all sexual partners that you have a vaginal infection. They should see their health care provider and be treated if they have problems, such as a mild rash or itching.  Practice safe sex by using condoms and only having one sex partner. SEEK MEDICAL CARE IF:   Your symptoms are not improving after 3 days of treatment.  You have increased discharge or pain.  You have a fever. MAKE SURE YOU:   Understand these instructions.  Will watch your condition.  Will get help right away if you are not doing well or get worse. FOR MORE INFORMATION  Centers for Disease Control and Prevention, Division of STD Prevention: [employer]raiser[employer]raud[employer] American Sexual Health Association (ASHA): www.ashastd.org  Document Released: 08/15/2005 Document Revised: 06/05/2013 Document Reviewed: 03/27/2013 [facility] Patient Information 2015 [place], [place]. This information is not intended to replace advice given to you by your health care provider. Make sure you discuss any questions you have with your health care provider.  Ectopic Pregnancy An ectopic pregnancy is when the fertilized egg attaches (implants) outside the uterus. Most ectopic pregnancies occur in the fallopian tube. Rarely do ectopic pregnancies occur on the ovary, intestine, pelvis, or cervix. In an ectopic pregnancy, the fertilized egg does not have the ability to develop into a normal, healthy baby.  A ruptured ectopic pregnancy is  one in which the fallopian tube gets torn or bursts and results in internal bleeding. Often there is intense abdominal pain, and sometimes, vaginal bleeding. Having an ectopic  pregnancy can be life threatening. If left untreated, this dangerous condition can lead to a blood transfusion, abdominal surgery, or even death. CAUSES  Damage to the fallopian tubes is the suspected cause in most ectopic pregnancies.  RISK FACTORS Depending on your circumstances, the risk of having an ectopic pregnancy will vary. The level of risk can be divided into three categories. High Risk  You have gone through infertility treatment.  You have had a previous ectopic pregnancy.  You have had previous tubal surgery.  You have had previous surgery to have the fallopian tubes tied (tubal ligation).  You have tubal problems or diseases.  You have been exposed to DES. DES is a medicine that was used until 1971 and had effects on babies whose mothers took the medicine.  You become pregnant while using an intrauterine device (IUD) for birth control. Moderate Risk  You have a history of infertility.  You have a history of a sexually transmitted infection (STI).  You have a history of pelvic inflammatory disease (PID).  You have scarring from endometriosis.  You have multiple sexual partners.  You smoke. Low Risk  You have had previous pelvic surgery.  You use vaginal douching.  You became sexually active before 31 years of age. SIGNS AND SYMPTOMS  An ectopic pregnancy should be suspected in anyone who has missed a period and has abdominal pain or bleeding.  You may experience normal pregnancy symptoms, such as:  Nausea.  Tiredness.  Breast tenderness.  Other symptoms may include:  Pain with intercourse.  Irregular vaginal bleeding or spotting.  Cramping or pain on one side or in the lower abdomen.  Fast heartbeat.  Passing out while having a bowel movement.  Symptoms of a ruptured ectopic pregnancy and internal bleeding may include:  Sudden, severe pain in the abdomen and pelvis.  Dizziness or fainting.  Pain in the shoulder area. DIAGNOSIS    Tests that may be performed include:  A pregnancy test.  An ultrasound test.  Testing the specific level of pregnancy hormone in the bloodstream.  Taking a sample of uterus tissue (dilation and curettage, D&C).  Surgery to perform a visual exam of the inside of the abdomen using a thin, lighted tube with a tiny camera on the end (laparoscope). TREATMENT  An injection of a medicine called methotrexate may be given. This medicine causes the pregnancy tissue to be absorbed. It is given if:  The diagnosis is made early.  The fallopian tube has not ruptured.  You are considered to be a good candidate for the medicine. Usually, pregnancy hormone blood levels are checked after methotrexate treatment. This is to be sure the medicine is effective. It may take 4-6 weeks for the pregnancy to be absorbed (though most pregnancies will be absorbed by 3 weeks). Surgical treatment may be needed. A laparoscope may be used to remove the pregnancy tissue. If severe internal bleeding occurs, a cut (incision) may be made in the lower abdomen (laparotomy), and the ectopic pregnancy is removed. This stops the bleeding. Part of the fallopian tube, or the whole tube, may be removed as well (salpingectomy). After surgery, pregnancy hormone tests may be done to be sure there is no pregnancy tissue left. You may receive a Rho (D) immune globulin shot if you are Rh negative and the father is Rh  positive, or if you do not know the Rh type of the father. This is to prevent problems with any future pregnancy. SEEK IMMEDIATE MEDICAL CARE IF:  You have any symptoms of an ectopic pregnancy. This is a medical emergency. MAKE SURE YOU:  Understand these instructions.  Will watch your condition.  Will get help right away if you are not doing well or get worse. Document Released: 09/22/2004 Document Revised: 12/30/2013 Document Reviewed: 03/14/2013 [facility] Patient Information 2015 [place], [place]. This information is not  intended to replace advice given to you by your health care provider. Make sure you discuss any questions you have with your health care provider.

## 2014-10-17 NOTE — ED Provider Notes (Signed)
CSN: [id number]     Arrival date & time 10/17/14  0251 History   First MD Initiated Contact with Patient 10/17/14 0413     Chief Complaint  Patient presents with  . Abdominal Pain    (Consider location/radiation/quality/duration/timing/severity/associated sxs/prior Treatment) HPI Comments: Patient is a 29 y/o G78P2012 female who presents to the ED for further evaluation of abdominal pain. Patient reports generalized abdominal pain which has been present for the last 6 days. She states her pain is sharp and constant. Severity is waxing and waning without modifying factors. Patient reports associated nausea and emesis on the first day of symptoms only. Patient denies taking any medications for pain. She is concerned she may be pregnant as her last menstrual period was 2 months ago. Patient denies associated fever, chills, shortness of breath, diarrhea, urinary symptoms, vaginal bleeding or discharge, and syncope. No history of abdominal surgeries, per patient.  Patient is a 29 y.o. female presenting with abdominal pain. The history is provided by the patient. No language interpreter was used.  Abdominal Pain Associated symptoms: nausea and vomiting   Associated symptoms: no diarrhea, no dysuria, no fever, no hematuria, no shortness of breath, no vaginal bleeding and no vaginal discharge     Past Medical History  Diagnosis Date  . Breast cancer   . Migraine headache    History reviewed. No pertinent past surgical history. No family history on file. History  Substance Use Topics  . Smoking status: Current Every Day Smoker -- 0.25 packs/day    Types: Cigarettes  . Smokeless tobacco: Not on file  . Alcohol Use: No   OB History    No data available      Review of Systems  Constitutional: Negative for fever.  Respiratory: Negative for shortness of breath.   Gastrointestinal: Positive for nausea, vomiting and abdominal pain. Negative for diarrhea and blood in stool.  Genitourinary:  Negative for dysuria, hematuria, vaginal bleeding and vaginal discharge.  All other systems reviewed and are negative.   Allergies  Vancomycin  Home Medications   Prior to Admission medications   Medication Sig Start Date End Date Taking? Authorizing Provider  Ascorbic Acid (VITAMIN C PO) Take 1 tablet by mouth daily.    Historical Provider, MD  cephALEXin (KEFLEX) 500 MG capsule Take 1 capsule (500 mg total) by mouth 3 (three) times daily. Patient not taking: Reported on 09/15/2014 04/22/14   Waldemar Dickens, MD  fish oil-omega-3 fatty acids 1000 MG capsule Take 1 g by mouth 2 (two) times daily.    Historical Provider, MD  fluconazole (DIFLUCAN) 150 MG tablet Take 1 tablet (150 mg total) by mouth daily. Repeat dose in 3 days Patient not taking: Reported on 09/15/2014 04/22/14   Waldemar Dickens, MD   BP 103/45 mmHg  Pulse 87  Temp(Src) 98.3 F (36.8 C) (Oral)  Resp 16  Ht 5\' 5"  (1.651 m)  Wt 156 lb (70.761 kg)  BMI 25.96 kg/m2  SpO2 100%  LMP    Physical Exam  Constitutional: She is oriented to person, place, and time. She appears well-developed and well-nourished. No distress.  Nontoxic/nonseptic appearing  HENT:  Head: Normocephalic and atraumatic.  Eyes: Conjunctivae and EOM are normal. No scleral icterus.  Neck: Normal range of motion.  Cardiovascular: Normal rate, regular rhythm and intact distal pulses.   Pulmonary/Chest: Effort normal. No respiratory distress. She has no wheezes.  Respirations even and unlabored  Abdominal: Soft. There is tenderness. There is no rebound and no guarding.  Soft abdomen with diffuse tenderness to palpation. This is mild as patient exhibits no outward signs of discomfort such as wincing. No voluntary or involuntary guarding. No masses or peritoneal signs. No focal TTP.  Genitourinary: There is no rash, tenderness, lesion or injury on the right labia. There is no rash, tenderness, lesion or injury on the left labia. Uterus is tender. Cervix  exhibits no motion tenderness and no friability. Right adnexum displays tenderness. Right adnexum displays no mass and no fullness. Left adnexum displays tenderness. Left adnexum displays no mass and no fullness. Vaginal discharge (white discharge) found.  Musculoskeletal: Normal range of motion.  Neurological: She is alert and oriented to person, place, and time. She exhibits normal muscle tone. Coordination normal.  Skin: Skin is warm and dry. No rash noted. She is not diaphoretic. No erythema. No pallor.  Psychiatric: She has a normal mood and affect. Her behavior is normal.  Nursing note and vitals reviewed.   ED Course  Procedures (including critical care time) Labs Review Labs Reviewed  CBC WITH DIFFERENTIAL/PLATELET - Abnormal; Notable for the following:    RBC 3.69 (*)    HCT 34.5 (*)    All other components within normal limits  COMPREHENSIVE METABOLIC PANEL - Abnormal; Notable for the following:    Potassium 3.4 (*)    GFR calc non Af Amer 90 (*)    Anion gap 4 (*)    All other components within normal limits  URINALYSIS, ROUTINE W REFLEX MICROSCOPIC - Abnormal; Notable for the following:    Hgb urine dipstick TRACE (*)    Leukocytes, UA MODERATE (*)    All other components within normal limits  URINE MICROSCOPIC-ADD ON - Abnormal; Notable for the following:    Squamous Epithelial / LPF MANY (*)    Bacteria, UA FEW (*)    All other components within normal limits  HCG, QUANTITATIVE, PREGNANCY - Abnormal; Notable for the following:    hCG, Beta Chain, Quant, S 687 (*)    All other components within normal limits  POC URINE PREG, ED - Abnormal; Notable for the following:    Preg Test, Ur POSITIVE (*)    All other components within normal limits  URINE CULTURE  WET PREP, GENITAL  LIPASE, BLOOD  GC/CHLAMYDIA PROBE AMP ([place])    Imaging Review No results found.   EKG Interpretation None      MDM   Final diagnoses:  Abdominal pain  Pregnancy     29 year old female presents to the emergency department for further evaluation of abdominal pain. Abdominal pain has been constant over the last 6 days and waxing and waning in severity. Patient reports the pain to be sharp. She also experienced nausea and vomiting on day 1 of symptoms. Vomiting has not persisted since this time.  Physical exam today notable for diffuse tenderness to palpation on abdominal exam. Patient has no masses or peritoneal signs. She has no area of distinct focal tenderness. Appreciated tenderness to be mild as patient exhibits no outward signs of discomfort such as wincing or voluntary guarding. She is noted to have no leukocytosis today. No anemia or electrolyte imbalance. Liver and kidney function preserved. Lipase normal. Urinalysis today is consistent with contamination, will send urine culture to evaluate for UTI. Urine pregnancy is positive. Patient does have a documented negative urine pregnancy one month ago; hCG 687.  Abdominal ultrasound ordered to evaluate for causes of abdominal pain and to attempt to evaluate for ectopic pregnancy. Wet prep c/w  BV; will likely tx for this. Patient signed out to Margarita Mail, PA-C at [facility] who will follow-up on results of ultrasound imaging and disposition the patient appropriately.   Filed Vitals:   10/17/14 0253 10/17/14 0413  BP: 105/67 103/45  Pulse: 90 87  Temp: 98.3 F (36.8 C)   TempSrc: Oral   Resp: 16 16  Height: 5\' 5"  (1.651 m)   Weight: 156 lb (70.761 kg)   SpO2: 96% 100%     Antonietta Breach, PA-C 10/17/14 [id number]  Kalman Drape, MD 10/17/14 [phone]

## 2014-10-17 NOTE — ED Notes (Signed)
Pt. reports generalized abdominal pain  with nausea and emesis onset last week , denies diarrhea , no fever or chills, LMP 2 months ago unsure if she is pregnant .

## 2014-10-18 LAB — URINE CULTURE: Colony Count: 10000

## 2014-11-06 ENCOUNTER — Inpatient Hospital Stay (HOSPITAL_COMMUNITY): Payer: Medicaid - Out of State

## 2014-11-06 ENCOUNTER — Inpatient Hospital Stay (HOSPITAL_COMMUNITY)
Admission: AD | Admit: 2014-11-06 | Discharge: 2014-11-06 | Disposition: A | Discharge status: Home / Self Care | Payer: Medicaid - Out of State | Source: Ambulatory Visit | Attending: Obstetrics & Gynecology | Admitting: Obstetrics & Gynecology

## 2014-11-06 ENCOUNTER — Encounter (HOSPITAL_COMMUNITY): Payer: Self-pay

## 2014-11-06 DIAGNOSIS — R109 Unspecified abdominal pain: Secondary | ICD-10-CM | POA: Diagnosis not present

## 2014-11-06 DIAGNOSIS — Z3A08 8 weeks gestation of pregnancy: Secondary | ICD-10-CM | POA: Diagnosis not present

## 2014-11-06 DIAGNOSIS — O210 Mild hyperemesis gravidarum: Secondary | ICD-10-CM | POA: Insufficient documentation

## 2014-11-06 DIAGNOSIS — O219 Vomiting of pregnancy, unspecified: Secondary | ICD-10-CM

## 2014-11-06 DIAGNOSIS — O9989 Other specified diseases and conditions complicating pregnancy, childbirth and the puerperium: Secondary | ICD-10-CM

## 2014-11-06 DIAGNOSIS — Z3A01 Less than 8 weeks gestation of pregnancy: Secondary | ICD-10-CM

## 2014-11-06 DIAGNOSIS — O26899 Other specified pregnancy related conditions, unspecified trimester: Secondary | ICD-10-CM

## 2014-11-06 LAB — URINALYSIS, ROUTINE W REFLEX MICROSCOPIC
Bilirubin Urine: NEGATIVE
GLUCOSE, UA: NEGATIVE mg/dL
Ketones, ur: NEGATIVE mg/dL
NITRITE: NEGATIVE
PH: 6.5 (ref 5.0–8.0)
PROTEIN: NEGATIVE mg/dL
Specific Gravity, Urine: 1.015 (ref 1.005–1.030)
Urobilinogen, UA: 0.2 mg/dL (ref 0.0–1.0)

## 2014-11-06 LAB — URINE MICROSCOPIC-ADD ON

## 2014-11-06 MED ORDER — PROMETHAZINE HCL 25 MG/ML IJ SOLN
25.0000 mg | Freq: Once | INTRAMUSCULAR | Status: AC
  Administered 2014-11-06: 25 mg via INTRAMUSCULAR
  Filled 2014-11-06: qty 1

## 2014-11-06 MED ORDER — PROMETHAZINE HCL 25 MG PO TABS: 12.5000 mg | ORAL_TABLET | Freq: Four times a day (QID) | ORAL | Status: AC | PRN

## 2014-11-06 NOTE — MAU Provider Note (Signed)
History     CSN: [id number]  Arrival date and time: 11/06/14 0137   First Provider Initiated Contact with Patient 11/06/14 0234      No chief complaint on file.  HPI  Joanna Mercer is a 29 y.o. a G3P2002 at unknown EGA. She presents today with nausea and vomiting x 7 days. She denies any abdominal pain or vaginal bleeding. She was not given anything for nausea after her last ED visit.   Past Medical History  Diagnosis Date  . Breast cancer   . Migraine headache     History reviewed. No pertinent past surgical history.  History reviewed. No pertinent family history.  History  Substance Use Topics  . Smoking status: Former Smoker -- 0.25 packs/day    Types: Cigarettes  . Smokeless tobacco: Not on file  . Alcohol Use: No    Allergies:  Allergies  Allergen Reactions  . Vancomycin Hives, Itching and Rash    Prescriptions prior to admission  Medication Sig Dispense Refill Last Dose  . Ascorbic Acid (VITAMIN C PO) Take 1 tablet by mouth daily.   09/19/2014  . cephALEXin (KEFLEX) 500 MG capsule Take 1 capsule (500 mg total) by mouth 3 (three) times daily. (Patient not taking: Reported on 09/15/2014) 30 capsule 0 Not Taking at Unknown time  . fish oil-omega-3 fatty acids 1000 MG capsule Take 1 g by mouth 2 (two) times daily.   09/19/2014  . fluconazole (DIFLUCAN) 150 MG tablet Take 1 tablet (150 mg total) by mouth daily. Repeat dose in 3 days (Patient not taking: Reported on 09/15/2014) 2 tablet 0 Not Taking at Unknown time  . metroNIDAZOLE (FLAGYL) 500 MG tablet Take 1 tablet (500 mg total) by mouth 2 (two) times daily. 14 tablet 0     Review of Systems  Constitutional: Negative.   HENT: Negative.   Eyes: Negative.   Respiratory: Negative.   Cardiovascular: Negative.   Gastrointestinal: Positive for nausea and vomiting. Negative for abdominal pain, diarrhea and constipation.  Genitourinary: Negative for dysuria, urgency and frequency.  Musculoskeletal: Negative for  myalgias.  Skin: Negative.   Neurological: Negative for dizziness.  Endo/Heme/Allergies: Does not bruise/bleed easily.  Psychiatric/Behavioral: Negative for depression.   Physical Exam   Last menstrual period 08/15/2014.  Physical Exam  Nursing note and vitals reviewed. Constitutional: She is oriented to person, place, and time. She appears well-developed and well-nourished. No distress.  Cardiovascular: Normal rate.   Respiratory: Effort normal.  GI: Soft. There is no tenderness. There is no rebound.  Neurological: She is alert and oriented to person, place, and time.  Skin: Skin is warm and dry.  Psychiatric: She has a normal mood and affect.    MAU Course  Procedures  Results for orders placed or performed during the hospital encounter of 11/06/14 (from the past 24 hour(s))  Urinalysis, Routine w reflex microscopic     Status: Abnormal   Collection Time: 11/06/14  1:50 AM  Result Value Ref Range   Color, Urine YELLOW YELLOW   APPearance CLOUDY (A) CLEAR   Specific Gravity, Urine 1.015 1.005 - 1.030   pH 6.5 5.0 - 8.0   Glucose, UA NEGATIVE NEGATIVE mg/dL   Hgb urine dipstick TRACE (A) NEGATIVE   Bilirubin Urine NEGATIVE NEGATIVE   Ketones, ur NEGATIVE NEGATIVE mg/dL   Protein, ur NEGATIVE NEGATIVE mg/dL   Urobilinogen, UA 0.2 0.0 - 1.0 mg/dL   Nitrite NEGATIVE NEGATIVE   Leukocytes, UA SMALL (A) NEGATIVE  Urine microscopic-add on  Status: Abnormal   Collection Time: 11/06/14  1:50 AM  Result Value Ref Range   Squamous Epithelial / LPF MANY (A) RARE   WBC, UA 0-2 <3 WBC/hpf   RBC / HPF 0-2 <3 RBC/hpf   Bacteria, UA RARE RARE    US Ob Transvaginal  11/06/2014   CLINICAL DATA:  Abdominal pain in pregnancy.  EXAM: TRANSVAGINAL OB ULTRASOUND  TECHNIQUE: Transvaginal ultrasound was performed for complete evaluation of the gestation as well as the maternal uterus, adnexal regions, and pelvic cul-de-sac.  COMPARISON:  10/17/2014  FINDINGS: Intrauterine gestational sac:  Visualized/normal in shape.  Yolk sac:  Present.  Embryo:  Present.  Cardiac Activity: Present.  Heart Rate: 148 bpm  CRL:   13.6  mm   7 w 5 d                  [place] EDC: 06/20/2015  Maternal uterus/adnexae: Moderate subchorionic hemorrhage measuring 3.0 x 1.2 x 0.5 cm. The right ovary is normal. The left ovary is not definitively seen. No pelvic free fluid.  IMPRESSION: Single live intrauterine pregnancy estimated gestational age [redacted] weeks 5 days for estimated date of delivery 06/20/2015. Moderate amount of subchorionic hemorrhage is seen.   Electronically Signed   By: Jeb Levering M.D.   On: 11/06/2014 03:39   0343: Patient has had IM phenergan, nausea is improved  Assessment and Plan   1. Abdominal pain in pregnancy   2. Nausea/vomiting in pregnancy    DC home RX phenergan First trimester precautions reviewed Return to MAU as needed Start [facility] as soon as possible.   Follow-up Information    Follow up with [facility] [place].   Contact information:   [place] [address] [phone]       Mathis Bud 11/06/2014, 3:42 AM

## 2014-11-06 NOTE — Discharge Instructions (Signed)
Prenatal Care Providers [facility]  [phone]     Phone: [place]                      [facility]  @[place]     Phone: [phone]  Phone: [place]     Phone: [phone]  Phone: [facility] [place]                hone: [phone]  Phone: [phone]         [facility] [address]      Phone: [phone]  Phone: [phone]         [place] [facility]                Phone: [phone]  [place])          Phone: [phone] [facility]   Phone: [phone] Safe Medications in Pregnancy   Acne: Benzoyl Peroxide Salicylic Acid  Backache/Headache: Tylenol: 2 regular strength every 4 hours OR              2 Extra strength every 6 hours  Colds/Coughs/Allergies: Benadryl (alcohol free) 25 mg every 6 hours as needed Breath right strips Claritin Cepacol throat lozenges Chloraseptic throat spray Cold-Eeze- up to three times per day Cough drops, alcohol free Flonase (by prescription only) Guaifenesin Mucinex Robitussin DM (plain only, alcohol free) Saline nasal spray/drops Sudafed (pseudoephedrine) & Actifed ** use only after [redacted] weeks gestation and if you do not have high blood pressure Tylenol Vicks Vaporub Zinc lozenges Zyrtec   Constipation: Colace Ducolax suppositories Fleet enema Glycerin suppositories Metamucil Milk of magnesia Miralax Senokot Smooth move tea  Diarrhea: Kaopectate Imodium A-D  *NO pepto Bismol  Hemorrhoids: Anusol Anusol HC Preparation H Tucks  Indigestion: Tums Maalox Mylanta Zantac  Pepcid  Insomnia: Benadryl (alcohol free) 25mg  every 6 hours as needed Tylenol  PM Unisom, no Gelcaps  Leg Cramps: Tums MagGel  Nausea/Vomiting:  Bonine Dramamine Emetrol Ginger extract Sea bands Meclizine  Nausea medication to take during pregnancy:  Unisom (doxylamine succinate 25 mg tablets) Take one tablet daily at bedtime. If symptoms are not adequately controlled, the dose can be increased to a maximum recommended dose of two tablets daily (1/2 tablet in the morning, 1/2 tablet mid-afternoon and one at bedtime). Vitamin B6 100mg  tablets. Take one tablet twice a day (up to 200 mg per day).  Skin Rashes: Aveeno products Benadryl cream or 25mg  every 6 hours as needed Calamine Lotion 1% cortisone cream  Yeast infection: Gyne-lotrimin 7 Monistat 7   **If taking multiple medications, please check labels to avoid duplicating the same active ingredients **take medication as directed on the label ** Do not exceed 4000 mg of tylenol in 24 hours **Do not take medications that contain aspirin or ibuprofen    First Trimester of Pregnancy The first trimester of pregnancy is from week 1 until the end of week 12 (months 1 through 3). A week after a sperm fertilizes an egg, the egg will implant on the wall of the uterus. This embryo  will begin to develop into a baby. Genes from you and your partner are forming the baby. The female genes determine whether the baby is a boy or a girl. At 6-8 weeks, the eyes and face are formed, and the heartbeat can be seen on ultrasound. At the end of 12 weeks, all the baby's organs are formed.  Now that you are pregnant, you will want to do everything you can to have a healthy baby. Two of the most important things are to get good prenatal care and to follow your health care provider's instructions. Prenatal care is all the medical care you receive before the baby's birth. This care will help prevent, find, and treat any problems during the pregnancy and childbirth. BODY CHANGES Your body goes through many changes during pregnancy.  The changes vary from woman to woman.   You may gain or lose a couple of pounds at first.  You may feel sick to your stomach (nauseous) and throw up (vomit). If the vomiting is uncontrollable, call your health care provider.  You may tire easily.  You may develop headaches that can be relieved by medicines approved by your health care provider.  You may urinate more often. Painful urination may mean you have a bladder infection.  You may develop heartburn as a result of your pregnancy.  You may develop constipation because certain hormones are causing the muscles that push waste through your intestines to slow down.  You may develop hemorrhoids or swollen, bulging veins (varicose veins).  Your breasts may begin to grow larger and become tender. Your nipples may stick out more, and the tissue that surrounds them (areola) may become darker.  Your gums may bleed and may be sensitive to brushing and flossing.  Dark spots or blotches (chloasma, mask of pregnancy) may develop on your face. This will likely fade after the baby is born.  Your menstrual periods will stop.  You may have a loss of appetite.  You may develop cravings for certain kinds of food.  You may have changes in your emotions from day to day, such as being excited to be pregnant or being concerned that something may go wrong with the pregnancy and baby.  You may have more vivid and strange dreams.  You may have changes in your hair. These can include thickening of your hair, rapid growth, and changes in texture. Some women also have hair loss during or after pregnancy, or hair that feels dry or thin. Your hair will most likely return to normal after your baby is born. WHAT TO EXPECT AT YOUR PRENATAL VISITS During a routine prenatal visit:  You will be weighed to make sure you and the baby are growing normally.  Your blood pressure will be taken.  Your abdomen will be measured to track your baby's growth.  The  fetal heartbeat will be listened to starting around week 10 or 12 of your pregnancy.  Test results from any previous visits will be discussed. Your health care provider may ask you:  How you are feeling.  If you are feeling the baby move.  If you have had any abnormal symptoms, such as leaking fluid, bleeding, severe headaches, or abdominal cramping.  If you have any questions. Other tests that may be performed during your first trimester include:  Blood tests to find your blood type and to check for the presence of any previous infections. They will also be used to check for low iron levels (anemia) and Rh antibodies.  Later in the pregnancy, blood tests for diabetes will be done along with other tests if problems develop.  Urine tests to check for infections, diabetes, or protein in the urine.  An ultrasound to confirm the proper growth and development of the baby.  An amniocentesis to check for possible genetic problems.  Fetal screens for spina bifida and Down syndrome.  You may need other tests to make sure you and the baby are doing well. HOME CARE INSTRUCTIONS  Medicines  Follow your health care provider's instructions regarding medicine use. Specific medicines may be either safe or unsafe to take during pregnancy.  Take your prenatal vitamins as directed.  If you develop constipation, try taking a stool softener if your health care provider approves. Diet  Eat regular, well-balanced meals. Choose a variety of foods, such as meat or vegetable-based protein, fish, milk and low-fat dairy products, vegetables, fruits, and whole grain breads and cereals. Your health care provider will help you determine the amount of weight gain that is right for you.  Avoid raw meat and uncooked cheese. These carry germs that can cause birth defects in the baby.  Eating four or five small meals rather than three large meals a day may help relieve nausea and vomiting. If you start to feel  nauseous, eating a few soda crackers can be helpful. Drinking liquids between meals instead of during meals also seems to help nausea and vomiting.  If you develop constipation, eat more high-fiber foods, such as fresh vegetables or fruit and whole grains. Drink enough fluids to keep your urine clear or pale yellow. Activity and Exercise  Exercise only as directed by your health care provider. Exercising will help you:  Control your weight.  Stay in shape.  Be prepared for labor and delivery.  Experiencing pain or cramping in the lower abdomen or low back is a good sign that you should stop exercising. Check with your health care provider before continuing normal exercises.  Try to avoid standing for long periods of time. Move your legs often if you must stand in one place for a long time.  Avoid heavy lifting.  Wear low-heeled shoes, and practice good posture.  You may continue to have sex unless your health care provider directs you otherwise. Relief of Pain or Discomfort  Wear a good support bra for breast tenderness.   Take warm sitz baths to soothe any pain or discomfort caused by hemorrhoids. Use hemorrhoid cream if your health care provider approves.   Rest with your legs elevated if you have leg cramps or low back pain.  If you develop varicose veins in your legs, wear support hose. Elevate your feet for 15 minutes, 3-4 times a day. Limit salt in your diet. Prenatal Care  Schedule your prenatal visits by the twelfth week of pregnancy. They are usually scheduled monthly at first, then more often in the last 2 months before delivery.  Write down your questions. Take them to your prenatal visits.  Keep all your prenatal visits as directed by your health care provider. Safety  Wear your seat belt at all times when driving.  Make a list of emergency phone numbers, including numbers for family, friends, the hospital, and police and fire departments. General Tips  Ask  your health care provider for a referral to a local prenatal education class. Begin classes no later than at the beginning of month 6 of your pregnancy.  Ask for help if you have counseling or nutritional needs during pregnancy.  Your health care provider can offer advice or refer you to specialists for help with various needs.  Do not use hot tubs, steam rooms, or saunas.  Do not douche or use tampons or scented sanitary pads.  Do not cross your legs for long periods of time.  Avoid cat litter boxes and soil used by cats. These carry germs that can cause birth defects in the baby and possibly loss of the fetus by miscarriage or stillbirth.  Avoid all smoking, herbs, alcohol, and medicines not prescribed by your health care provider. Chemicals in these affect the formation and growth of the baby.  Schedule a dentist appointment. At home, brush your teeth with a soft toothbrush and be gentle when you floss. SEEK MEDICAL CARE IF:   You have dizziness.  You have mild pelvic cramps, pelvic pressure, or nagging pain in the abdominal area.  You have persistent nausea, vomiting, or diarrhea.  You have a bad smelling vaginal discharge.  You have pain with urination.  You notice increased swelling in your face, hands, legs, or ankles. SEEK IMMEDIATE MEDICAL CARE IF:   You have a fever.  You are leaking fluid from your vagina.  You have spotting or bleeding from your vagina.  You have severe abdominal cramping or pain.  You have rapid weight gain or loss.  You vomit blood or material that looks like coffee grounds.  You are exposed to [place] measles and have never had them.  You are exposed to fifth disease or chickenpox.  You develop a severe headache.  You have shortness of breath.  You have any kind of trauma, such as from a fall or a car accident. Document Released: 08/09/2001 Document Revised: 12/30/2013 Document Reviewed: 06/25/2013 [facility] Patient Information 2015  [employer], [place]. This information is not intended to replace advice given to you by your health care provider. Make sure you discuss any questions you have with your health care provider.

## 2014-11-13 ENCOUNTER — Emergency Department (HOSPITAL_COMMUNITY)
Admission: EM | Admit: 2014-11-13 | Discharge: 2014-11-13 | Disposition: A | Discharge status: Home / Self Care | Payer: Medicaid - Out of State | Attending: Emergency Medicine | Admitting: Emergency Medicine

## 2014-11-13 ENCOUNTER — Encounter (HOSPITAL_COMMUNITY): Payer: Self-pay | Admitting: Emergency Medicine

## 2014-11-13 DIAGNOSIS — Z3A08 8 weeks gestation of pregnancy: Secondary | ICD-10-CM | POA: Diagnosis not present

## 2014-11-13 DIAGNOSIS — O2341 Unspecified infection of urinary tract in pregnancy, first trimester: Secondary | ICD-10-CM | POA: Insufficient documentation

## 2014-11-13 DIAGNOSIS — Z87891 Personal history of nicotine dependence: Secondary | ICD-10-CM | POA: Insufficient documentation

## 2014-11-13 DIAGNOSIS — R112 Nausea with vomiting, unspecified: Secondary | ICD-10-CM

## 2014-11-13 DIAGNOSIS — N390 Urinary tract infection, site not specified: Secondary | ICD-10-CM

## 2014-11-13 DIAGNOSIS — Z853 Personal history of malignant neoplasm of breast: Secondary | ICD-10-CM | POA: Insufficient documentation

## 2014-11-13 DIAGNOSIS — Z79899 Other long term (current) drug therapy: Secondary | ICD-10-CM | POA: Insufficient documentation

## 2014-11-13 DIAGNOSIS — Z8679 Personal history of other diseases of the circulatory system: Secondary | ICD-10-CM | POA: Insufficient documentation

## 2014-11-13 DIAGNOSIS — O210 Mild hyperemesis gravidarum: Secondary | ICD-10-CM | POA: Insufficient documentation

## 2014-11-13 DIAGNOSIS — Z3490 Encounter for supervision of normal pregnancy, unspecified, unspecified trimester: Secondary | ICD-10-CM

## 2014-11-13 LAB — URINE MICROSCOPIC-ADD ON

## 2014-11-13 LAB — COMPREHENSIVE METABOLIC PANEL
ALK PHOS: 62 U/L (ref 39–117)
ALT: 9 U/L (ref 0–35)
ANION GAP: 12 (ref 5–15)
AST: 15 U/L (ref 0–37)
Albumin: 3.8 g/dL (ref 3.5–5.2)
BILIRUBIN TOTAL: 0.7 mg/dL (ref 0.3–1.2)
BUN: 6 mg/dL (ref 6–23)
CHLORIDE: 100 mmol/L (ref 96–112)
CO2: 21 mmol/L (ref 19–32)
Calcium: 9.4 mg/dL (ref 8.4–10.5)
Creatinine, Ser: 0.76 mg/dL (ref 0.50–1.10)
GFR calc non Af Amer: >90 mL/min (ref >90)
Glucose, Bld: 102 mg/dL — ABNORMAL HIGH (ref 70–99)
Potassium: 3.9 mmol/L (ref 3.5–5.1)
Sodium: 133 mmol/L — ABNORMAL LOW (ref 135–145)
TOTAL PROTEIN: 7.9 g/dL (ref 6.0–8.3)

## 2014-11-13 LAB — URINALYSIS, ROUTINE W REFLEX MICROSCOPIC
BILIRUBIN URINE: NEGATIVE
Glucose, UA: NEGATIVE mg/dL
Ketones, ur: 40 mg/dL — AB
Nitrite: NEGATIVE
PROTEIN: NEGATIVE mg/dL
Specific Gravity, Urine: 1.012 (ref 1.005–1.030)
Urobilinogen, UA: 0.2 mg/dL (ref 0.0–1.0)
pH: 6.5 (ref 5.0–8.0)

## 2014-11-13 LAB — I-STAT CHEM 8, ED
BUN: 5 mg/dL — ABNORMAL LOW (ref 6–23)
CALCIUM ION: 1.15 mmol/L (ref 1.12–1.23)
CHLORIDE: 100 mmol/L (ref 96–112)
Creatinine, Ser: 0.6 mg/dL (ref 0.50–1.10)
Glucose, Bld: 111 mg/dL — ABNORMAL HIGH (ref 70–99)
HCT: 38 % (ref 36.0–46.0)
HEMOGLOBIN: 12.9 g/dL (ref 12.0–15.0)
Potassium: 4 mmol/L (ref 3.5–5.1)
Sodium: 135 mmol/L (ref 135–145)
TCO2: 20 mmol/L (ref 0–100)

## 2014-11-13 LAB — CBC WITH DIFFERENTIAL/PLATELET
Basophils Absolute: 0 10*3/uL (ref 0.0–0.1)
Basophils Relative: 0 % (ref 0–1)
EOS ABS: 0.1 10*3/uL (ref 0.0–0.7)
Eosinophils Relative: 1 % (ref 0–5)
HCT: 34.5 % — ABNORMAL LOW (ref 36.0–46.0)
Hemoglobin: 12.3 g/dL (ref 12.0–15.0)
LYMPHS ABS: 2.4 10*3/uL (ref 0.7–4.0)
LYMPHS PCT: 22 % (ref 12–46)
MCH: 33.2 pg (ref 26.0–34.0)
MCHC: 35.7 g/dL (ref 30.0–36.0)
MCV: 93.2 fL (ref 78.0–100.0)
MONOS PCT: 8 % (ref 3–12)
Monocytes Absolute: 0.9 10*3/uL (ref 0.1–1.0)
NEUTROS PCT: 69 % (ref 43–77)
Neutro Abs: 7.7 10*3/uL (ref 1.7–7.7)
Platelets: 310 10*3/uL (ref 150–400)
RBC: 3.7 MIL/uL — ABNORMAL LOW (ref 3.87–5.11)
RDW: 11.6 % (ref 11.5–15.5)
WBC: 11 10*3/uL — ABNORMAL HIGH (ref 4.0–10.5)

## 2014-11-13 LAB — PREGNANCY, URINE: Preg Test, Ur: POSITIVE — AB

## 2014-11-13 LAB — LIPASE, BLOOD: LIPASE: 24 U/L (ref 11–59)

## 2014-11-13 MED ORDER — ONDANSETRON 4 MG PO TBDP: 4.0000 mg | ORAL_TABLET | Freq: Three times a day (TID) | ORAL | Status: AC | PRN

## 2014-11-13 MED ORDER — CEPHALEXIN 250 MG PO CAPS
500.0000 mg | ORAL_CAPSULE | Freq: Once | ORAL | Status: AC
  Administered 2014-11-13: 500 mg via ORAL
  Filled 2014-11-13: qty 2

## 2014-11-13 MED ORDER — SODIUM CHLORIDE 0.9 % IV BOLUS (SEPSIS)
1000.0000 mL | Freq: Once | INTRAVENOUS | Status: AC
  Administered 2014-11-13: 1000 mL via INTRAVENOUS

## 2014-11-13 MED ORDER — ONDANSETRON HCL 4 MG/2ML IJ SOLN
4.0000 mg | Freq: Once | INTRAMUSCULAR | Status: AC
  Administered 2014-11-13: 4 mg via INTRAVENOUS
  Filled 2014-11-13: qty 2

## 2014-11-13 MED ORDER — CEPHALEXIN 500 MG PO CAPS: 500.0000 mg | ORAL_CAPSULE | Freq: Four times a day (QID) | ORAL | Status: AC

## 2014-11-13 NOTE — Discharge Instructions (Signed)
Take Keflex as directed until gone. Take zofran as needed for nausea. Refer to attached documents for more information. Follow up with [facility] for further evaluation.

## 2014-11-13 NOTE — ED Notes (Addendum)
C/o nausea, vomiting, and chills x 19 days.  States she is [redacted] weeks pregnant. Seen at [facility] on 3/10 for same.

## 2014-11-13 NOTE — ED Provider Notes (Signed)
CSN: [id number]     Arrival date & time 11/13/14  0153 History   First MD Initiated Contact with Patient 11/13/14 [phone]     Chief Complaint  Patient presents with  . Emesis During Pregnancy     (Consider location/radiation/quality/duration/timing/severity/associated sxs/prior Treatment) HPI Comments: Patient is a 29 year old female who is [redacted] weeks pregnant who presents with nausea and vomiting for the past 18 days. Symptoms started gradually and progressively worsened since the onset. Patient reports 20+ episodes of vomiting per day. She has tried phenergan for symptoms without relief. No aggravating/alleviating factors. She reports associated chills. Patient has not had any prenatal care so far, despite being instructed to see an OBGYN 4 weeks ago. Patient denies fever or any other associated symptoms.    Past Medical History  Diagnosis Date  . Breast cancer   . Migraine headache    History reviewed. No pertinent past surgical history. No family history on file. History  Substance Use Topics  . Smoking status: Former Smoker -- 0.25 packs/day    Types: Cigarettes  . Smokeless tobacco: Not on file  . Alcohol Use: No   OB History    Gravida Para Term Preterm AB TAB SAB Ectopic Multiple Living   1              Review of Systems  Constitutional: Negative for fever, chills and fatigue.  HENT: Negative for trouble swallowing.   Eyes: Negative for visual disturbance.  Respiratory: Negative for shortness of breath.   Cardiovascular: Negative for chest pain and palpitations.  Gastrointestinal: Positive for nausea and vomiting. Negative for abdominal pain and diarrhea.  Genitourinary: Negative for dysuria and difficulty urinating.  Musculoskeletal: Negative for arthralgias and neck pain.  Skin: Negative for color change.  Neurological: Negative for dizziness and weakness.  Psychiatric/Behavioral: Negative for dysphoric mood.      Allergies  Vancomycin  Home Medications    Prior to Admission medications   Medication Sig Start Date End Date Taking? Authorizing Provider  fish oil-omega-3 fatty acids 1000 MG capsule Take 1 g by mouth 2 (two) times daily.   Yes Historical Provider, MD  promethazine (PHENERGAN) 25 MG tablet Take 0.5-1 tablets (12.5-25 mg total) by mouth every 6 (six) hours as needed. 11/06/14  Yes Heather D Hogan, CNM   BP 104/66 mmHg  Pulse 72  Temp(Src) 97.6 F (36.4 C) (Oral)  Resp 20  Ht 5\' 5"  (1.651 m)  Wt 133 lb 6.4 oz (60.51 kg)  BMI 22.20 kg/m2  SpO2 100%  LMP 08/15/2014 Physical Exam  Constitutional: She is oriented to person, place, and time. She appears well-developed and well-nourished. No distress.  HENT:  Head: Normocephalic and atraumatic.  Eyes: Conjunctivae and EOM are normal. Pupils are equal, round, and reactive to light.  Neck: Normal range of motion.  Cardiovascular: Normal rate and regular rhythm.  Exam reveals no gallop and no friction rub.   No murmur heard. Pulmonary/Chest: Effort normal and breath sounds normal. She has no wheezes. She has no rales. She exhibits no tenderness.  Abdominal: Soft. She exhibits no distension. There is no tenderness. There is no rebound.  Musculoskeletal: Normal range of motion.  Neurological: She is alert and oriented to person, place, and time. Coordination normal.  Speech is goal-oriented. Moves limbs without ataxia.   Skin: Skin is warm and dry.  Psychiatric: She has a normal mood and affect. Her behavior is normal.  Nursing note and vitals reviewed.   ED Course  Procedures (  including critical care time) Labs Review Labs Reviewed  CBC WITH DIFFERENTIAL/PLATELET - Abnormal; Notable for the following:    WBC 11.0 (*)    RBC 3.70 (*)    HCT 34.5 (*)    All other components within normal limits  COMPREHENSIVE METABOLIC PANEL - Abnormal; Notable for the following:    Sodium 133 (*)    Glucose, Bld 102 (*)    All other components within normal limits  URINALYSIS, ROUTINE W  REFLEX MICROSCOPIC - Abnormal; Notable for the following:    APPearance TURBID (*)    Hgb urine dipstick TRACE (*)    Ketones, ur 40 (*)    Leukocytes, UA LARGE (*)    All other components within normal limits  PREGNANCY, URINE - Abnormal; Notable for the following:    Preg Test, Ur POSITIVE (*)    All other components within normal limits  URINE MICROSCOPIC-ADD ON - Abnormal; Notable for the following:    Squamous Epithelial / LPF MANY (*)    Bacteria, UA MANY (*)    All other components within normal limits  I-STAT CHEM 8, ED - Abnormal; Notable for the following:    BUN 5 (*)    Glucose, Bld 111 (*)    All other components within normal limits  LIPASE, BLOOD    Imaging Review No results found.   EKG Interpretation None      MDM   Final diagnoses:  UTI (lower urinary tract infection)  Non-intractable vomiting with nausea, vomiting of unspecified type  Pregnancy   4:05 AM Labs pending. Vitals stable and patient afebrile. Patient will have fluids and zofran.   5:30 AM Urinalysis shows UTI. WBC mildly elevated at 11. Patient will be discharged with keflex and zofran for symptoms. Vitals stable and patient afebrile.    Alvina Chou, PA-C 11/13/14 [place], MD 11/13/14 [phone]

## 2015-05-07 IMAGING — CT CT HEAD W/O CM
1 series · 16 of 28 positions shown, 20 images · non-contrast
Comparison: None.

CLINICAL DATA: Headache for 2 days. Nausea and vomiting. Diarrhea.

EXAM:
CT HEAD WITHOUT CONTRAST
TECHNIQUE: Contiguous axial images were obtained from the base of the skull
through the vertex without intravenous contrast.

[Series 2: head 5.0 h30s · axial · 0.42mm/px · z∈[-146,-21]mm · 16 of 28 slices shown, 20 images]
[im 2/28  brain]
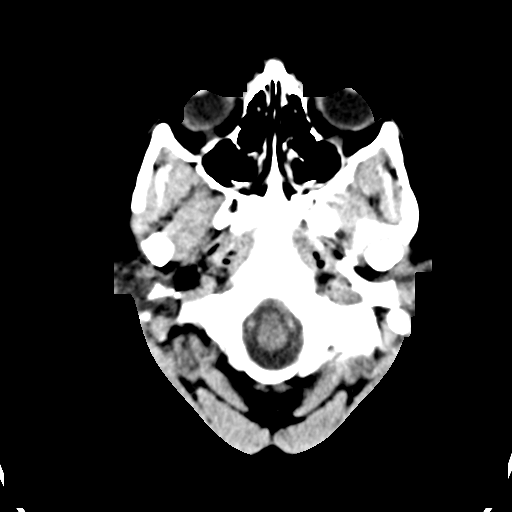
[im 2/28  bone]
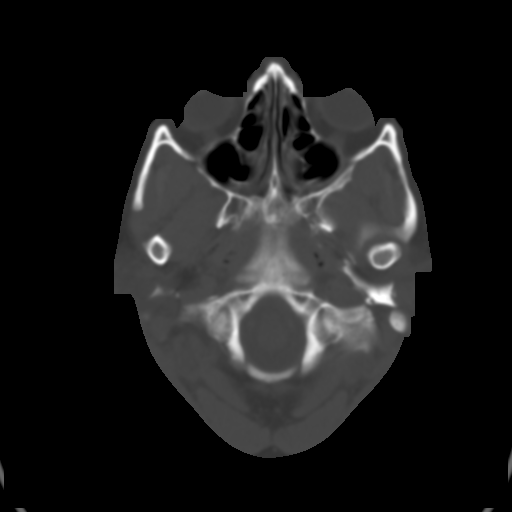
[im 4/28  brain]
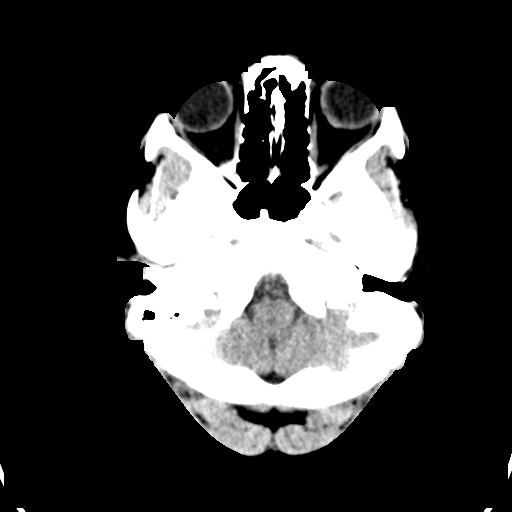
[im 6/28  brain]
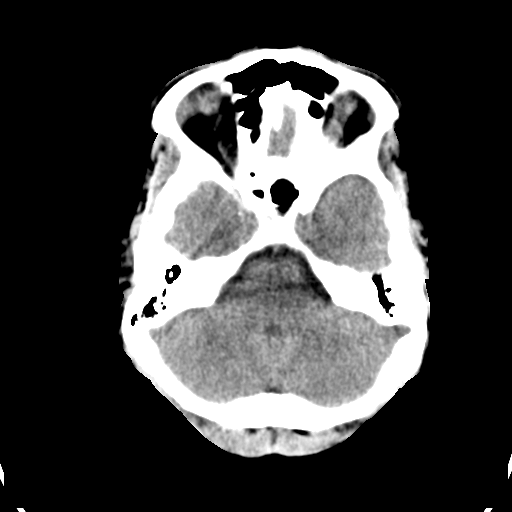
[im 7/28  brain]
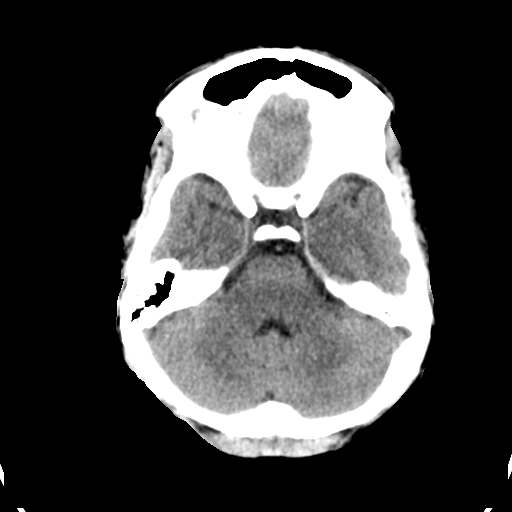
[im 9/28  brain]
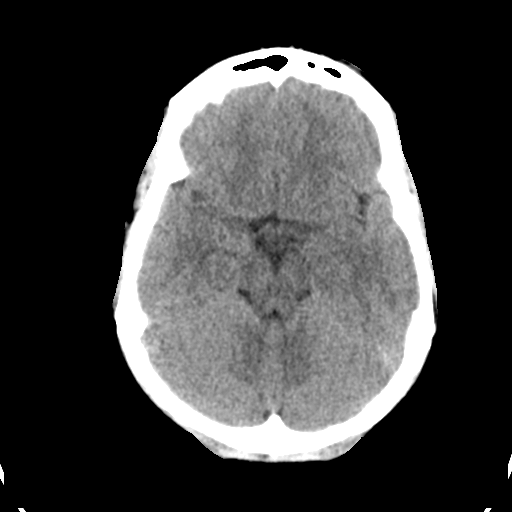
[im 9/28  bone]
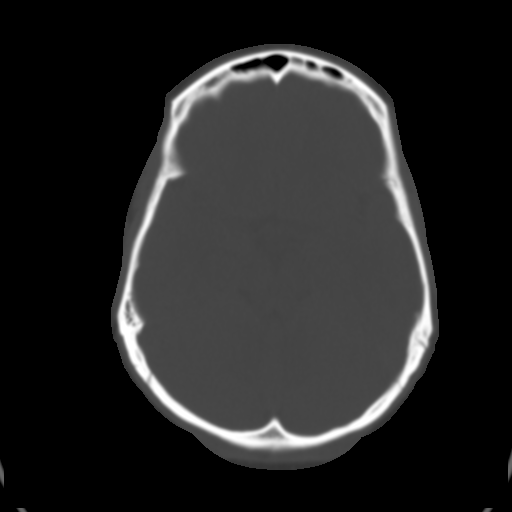
[im 10/28  brain]
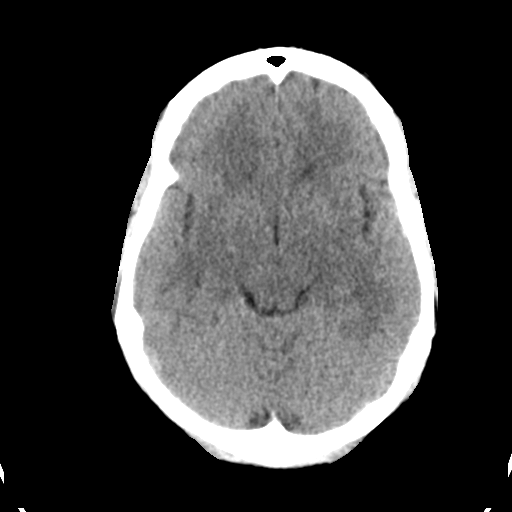
[im 12/28  brain]
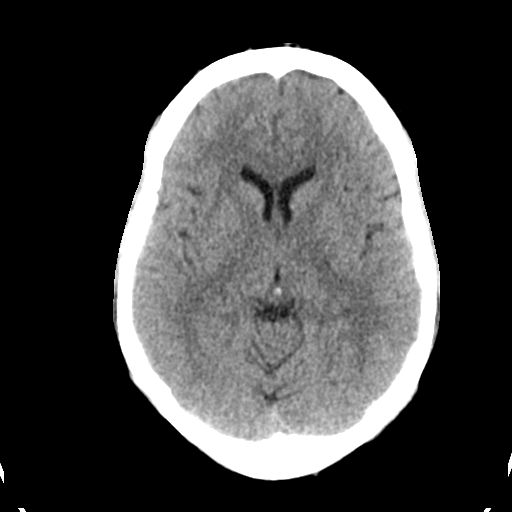
[im 14/28  brain]
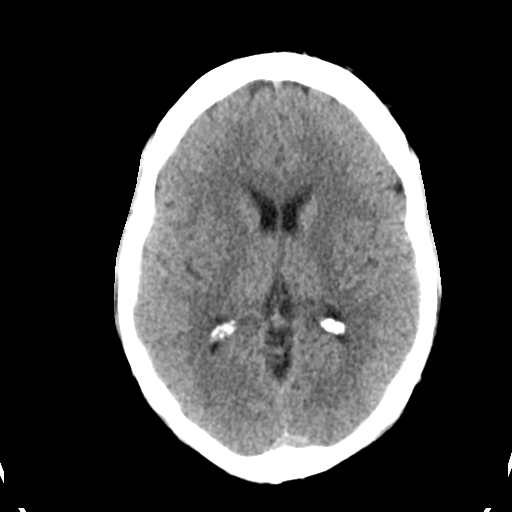
[im 15/28  brain]
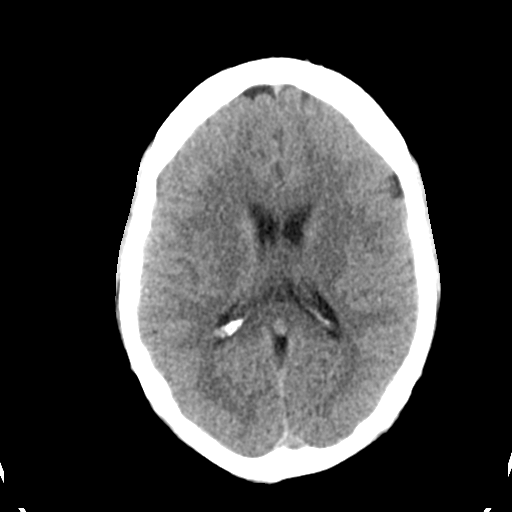
[im 15/28  bone]
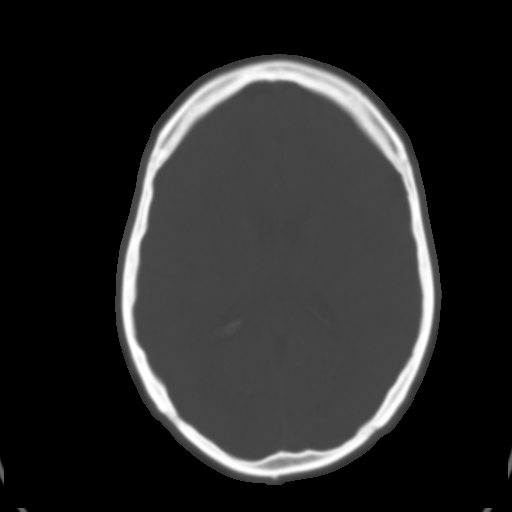
[im 17/28  brain]
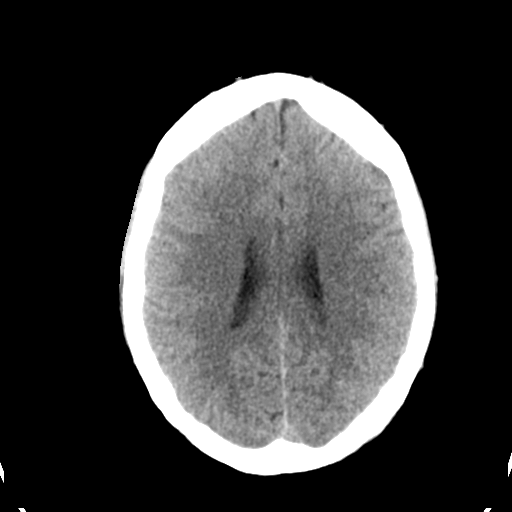
[im 19/28  brain]
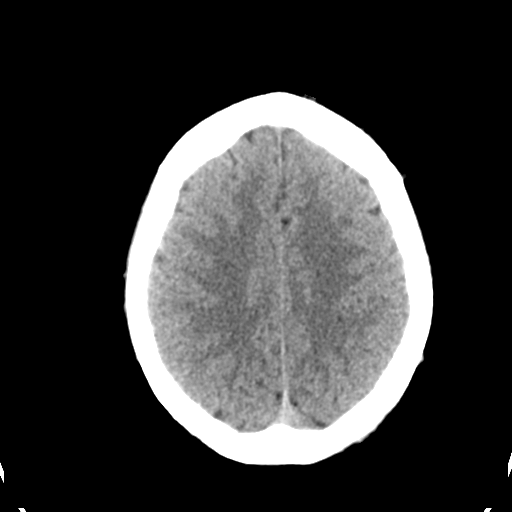
[im 20/28  brain]
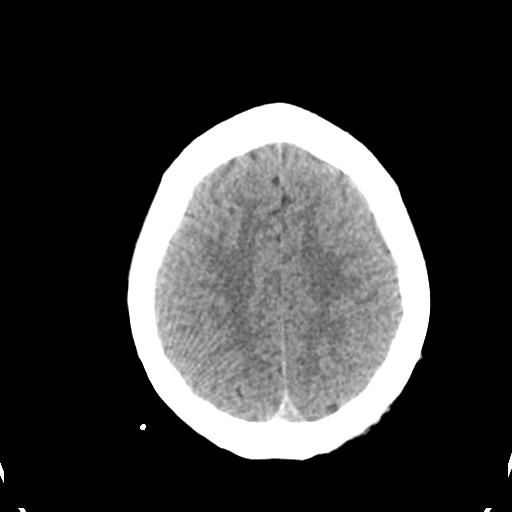
[im 22/28  brain]
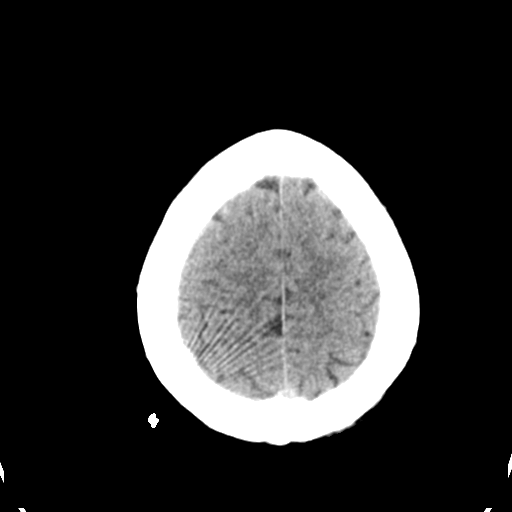
[im 22/28  bone]
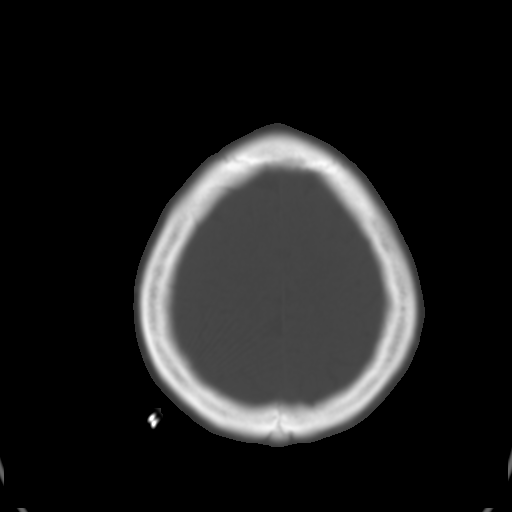
[im 23/28  brain]
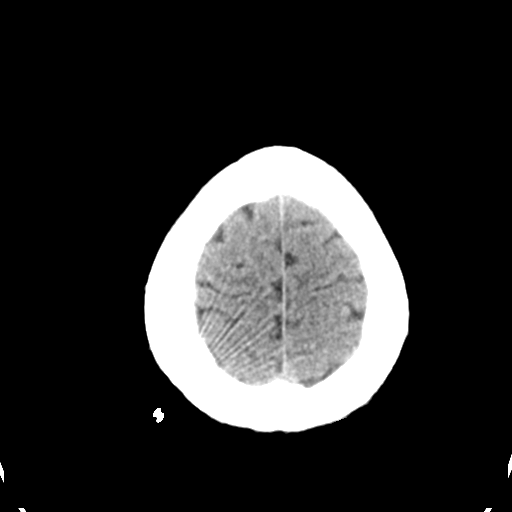
[im 25/28  brain]
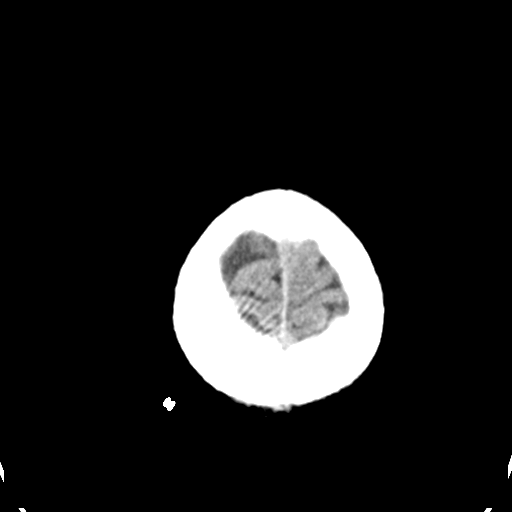
[im 27/28  brain]
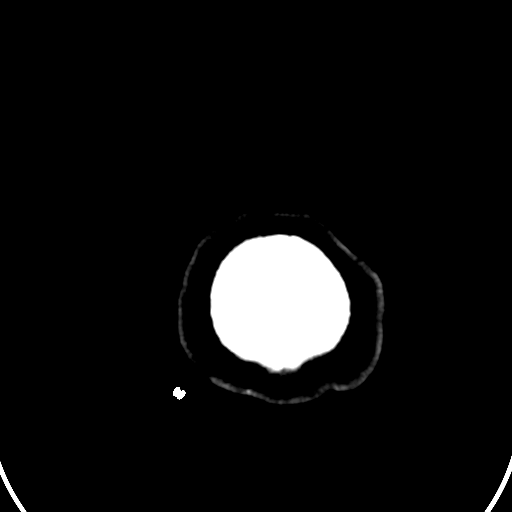

[16 of 28 positions shown; findings below may reference images not displayed]

FINDINGS: There is no evidence of acute infarction, mass lesion, or intra- or
extra-axial hemorrhage on CT.

The posterior fossa, including the cerebellum, brainstem and fourth
ventricle, is within normal limits. The third and lateral
ventricles, and basal ganglia are unremarkable in appearance. The
cerebral hemispheres are symmetric in appearance, with normal
gray-white differentiation. No mass effect or midline shift is seen.

There is no evidence of fracture; visualized osseous structures are
unremarkable in appearance. The visualized portions of the orbits
are within normal limits. The paranasal sinuses and mastoid air
cells are well-aerated. No significant soft tissue abnormalities are
seen.
IMPRESSION: Unremarkable noncontrast CT of the head.

## 2015-09-11 ENCOUNTER — Encounter (HOSPITAL_COMMUNITY): Payer: Self-pay | Admitting: *Deleted

## 2016-01-06 IMAGING — US US OB TRANSVAGINAL
1 series · 14 of 28 positions shown · non-contrast
Comparison: None.

CLINICAL DATA: Abdominal pain. Pregnant 8 weeks 0 days by menstrual
history.

EXAM:
OBSTETRIC <14 WK US AND TRANSVAGINAL OB US
TECHNIQUE: Both transabdominal and transvaginal ultrasound examinations were
performed for complete evaluation of the gestation as well as the
maternal uterus, adnexal regions, and pelvic cul-de-sac.
Transvaginal technique was performed to assess early pregnancy.

[Series 1: us ob transvaginal · 0.22mm/px · 14 of 40 slices shown]
[im 2/40]
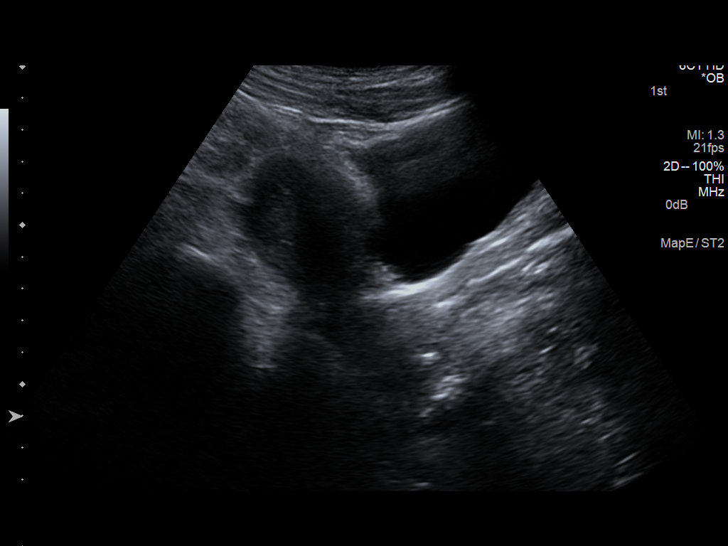
[im 5/40]
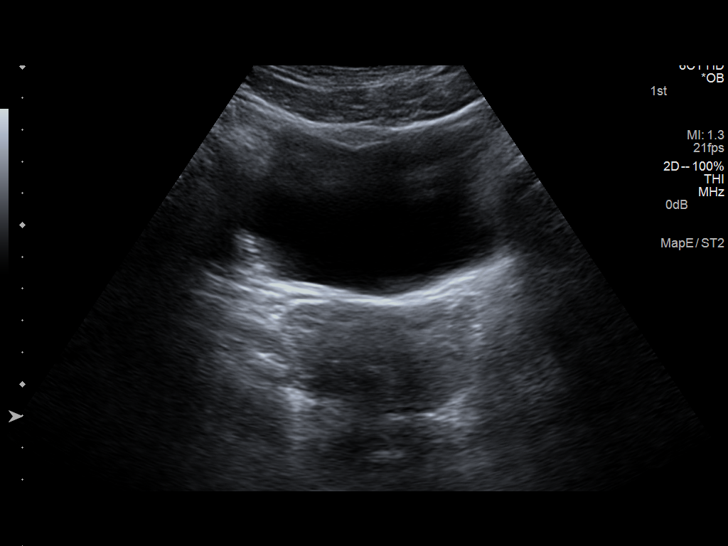
[im 8/40]
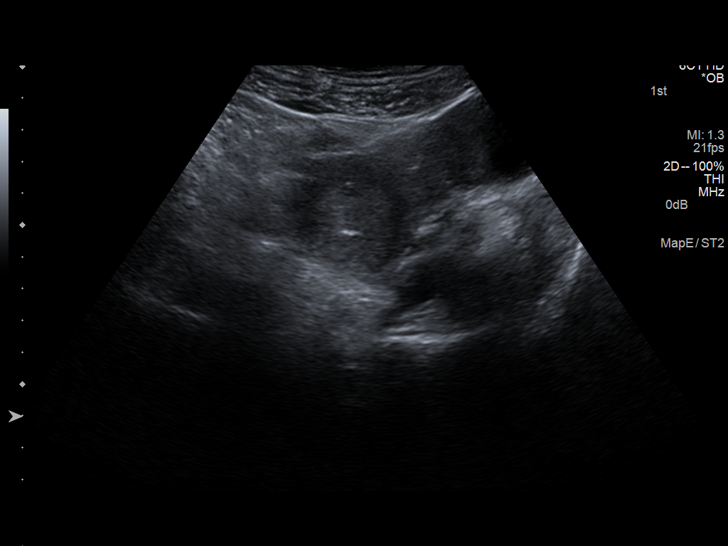
[im 11/40]
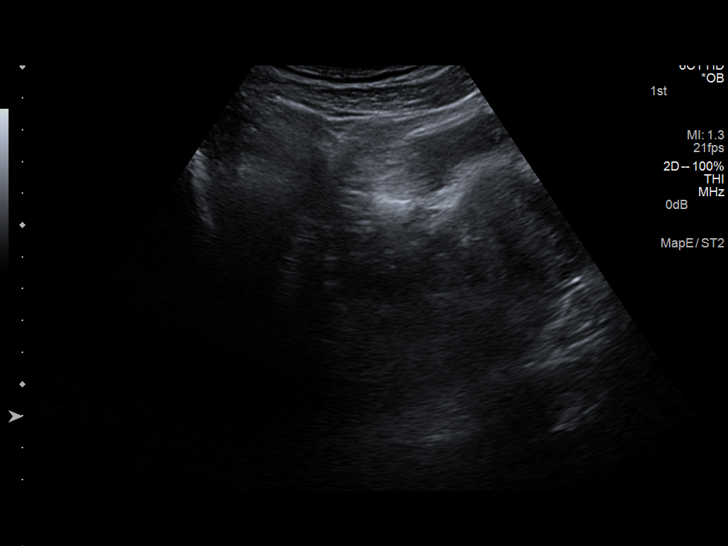
[im 14/40]
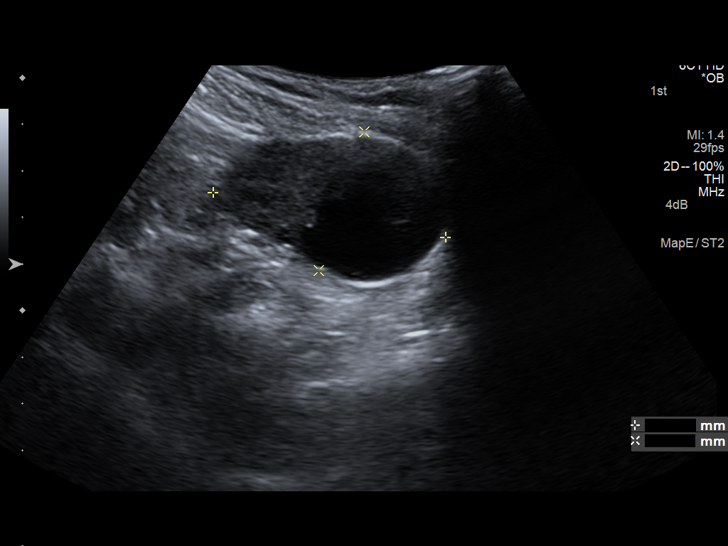
[im 16/40]
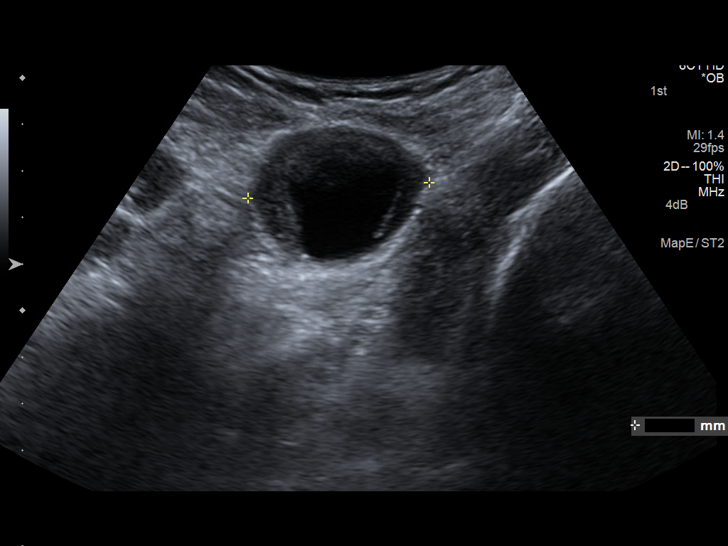
[im 19/40]
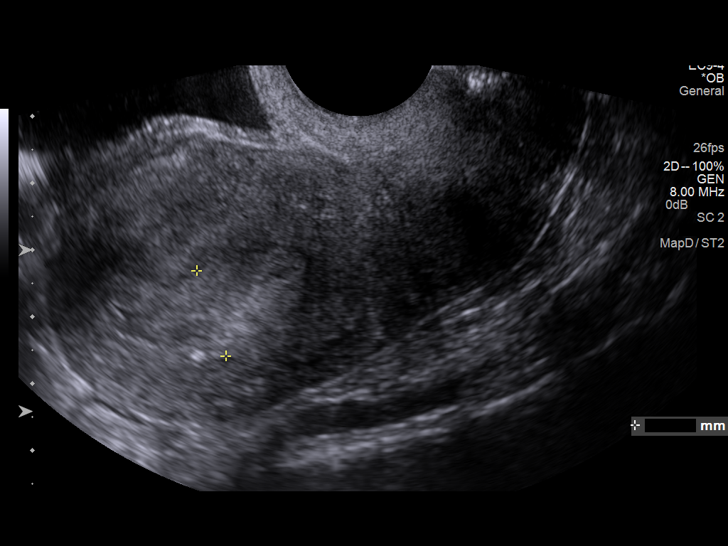
[im 22/40]
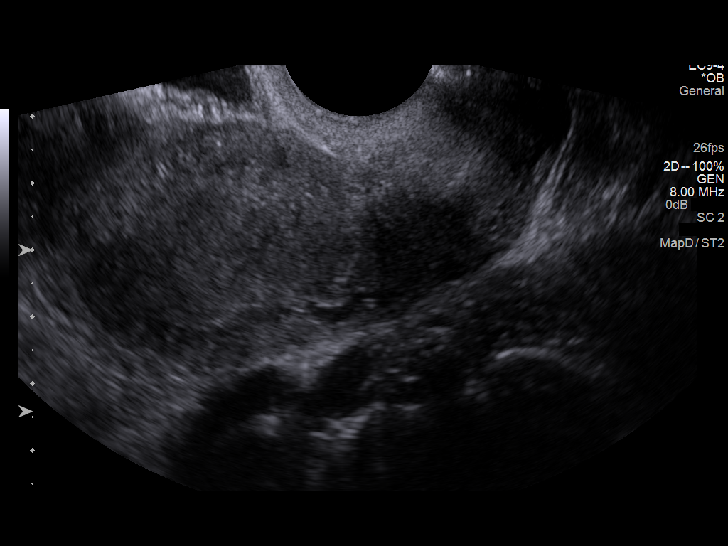
[im 25/40]
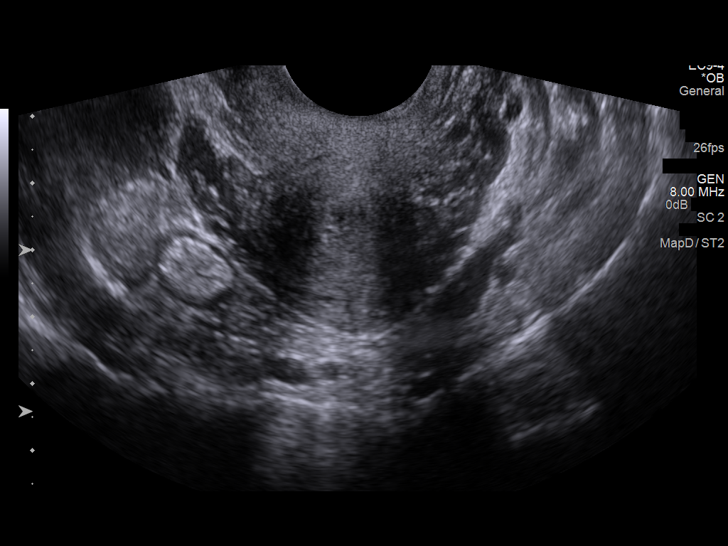
[im 28/40]
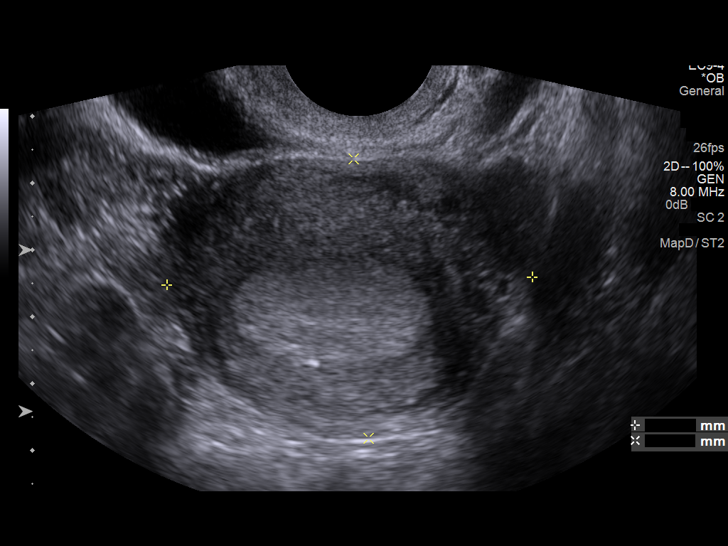
[im 31/40]
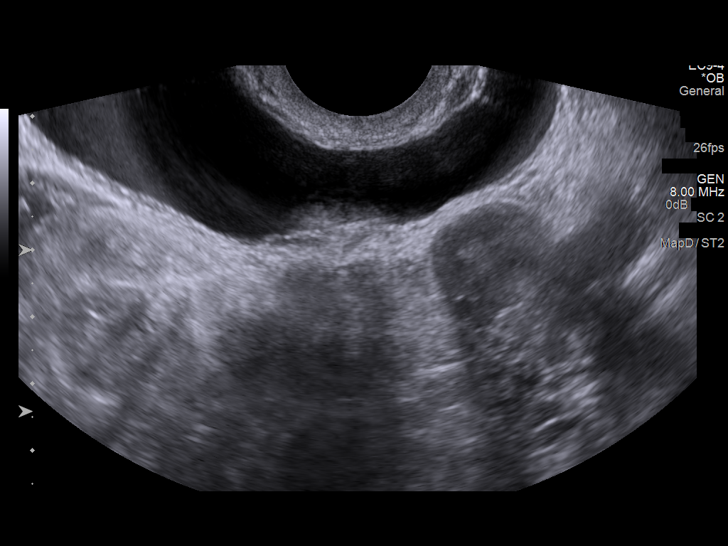
[im 34/40]
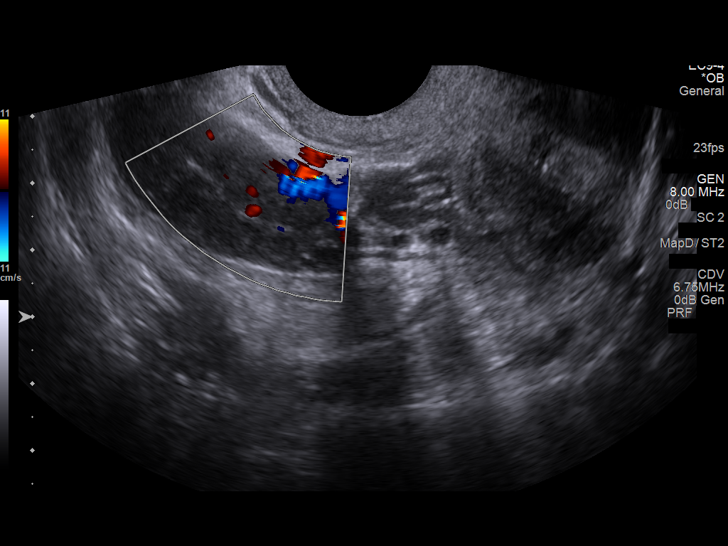
[im 37/40]
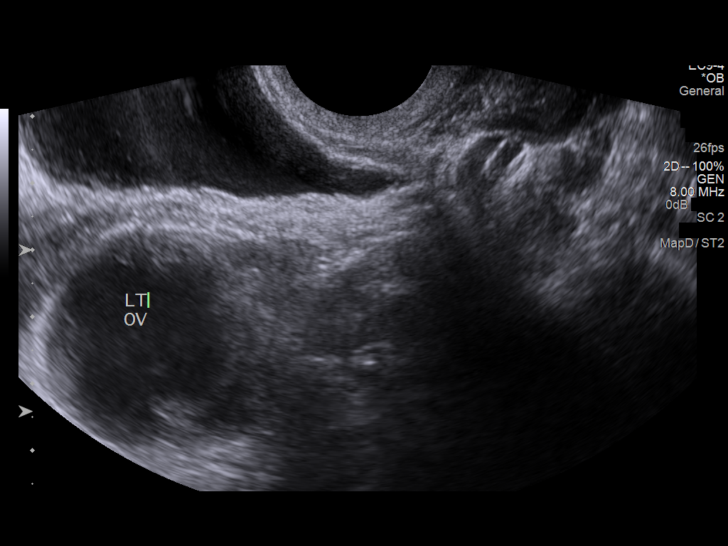
[im 40/40]
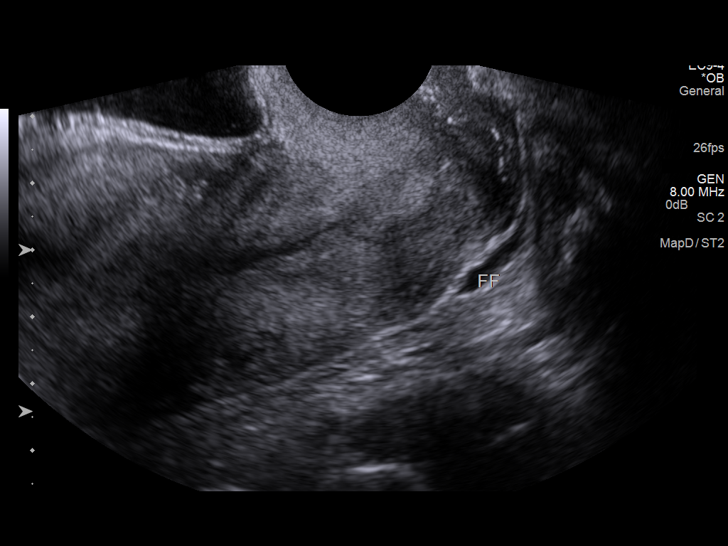

[14 of 28 positions shown; findings below may reference images not displayed]

FINDINGS: Intrauterine gestational sac: Not visible

Yolk sac:  Not visible

Embryo:  Not visible

Cardiac Activity: Not visible

Maternal uterus/adnexae: Normal right ovary. 3 cm left ovarian cyst.
Trace free pelvic fluid.
IMPRESSION: No gestational sac identified. Recommend follow-up ultrasound in
10-14 days and follow-up quantitative beta HCG levels. This
recommendation follows SRU consensus guidelines: Diagnostic Criteria
for Nonviable Pregnancy Early in the First Trimester. N Engl J Med

## 2016-01-26 IMAGING — US US OB TRANSVAGINAL
1 series · 14 of 28 positions shown · non-contrast
Comparison: 10/17/2014

CLINICAL DATA: Abdominal pain in pregnancy.

EXAM:
TRANSVAGINAL OB ULTRASOUND
TECHNIQUE: Transvaginal ultrasound was performed for complete evaluation of the
gestation as well as the maternal uterus, adnexal regions, and
pelvic cul-de-sac.

[Series 1: us ob follow up · 29 acquisitions, 14 frames shown]
[im 2/29]
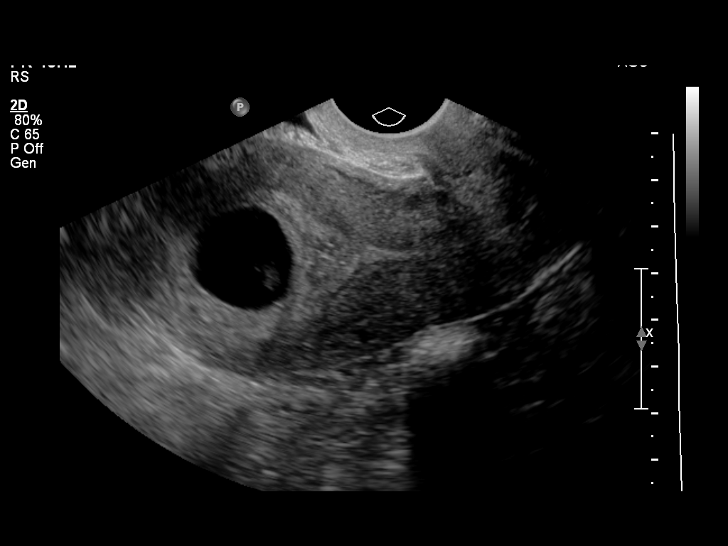
[im 4/29]
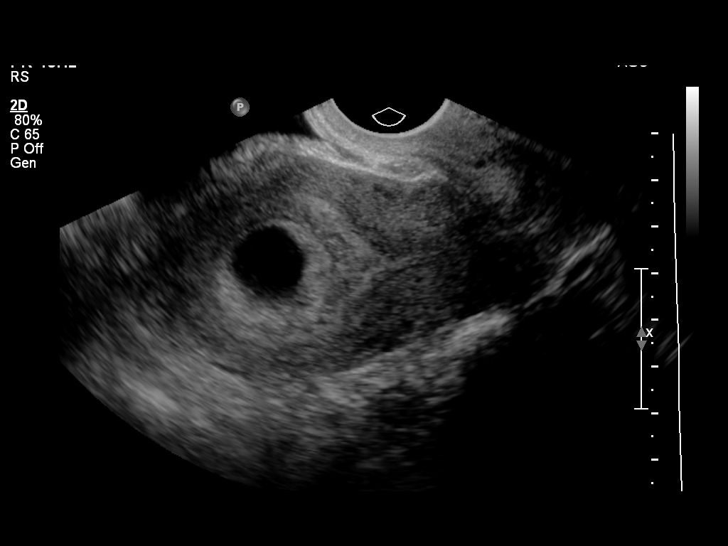
[im 6/29]
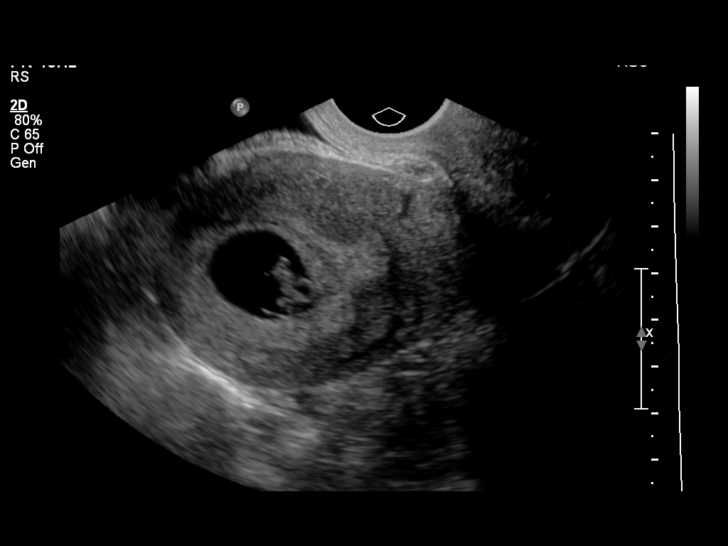
[im 8/29]
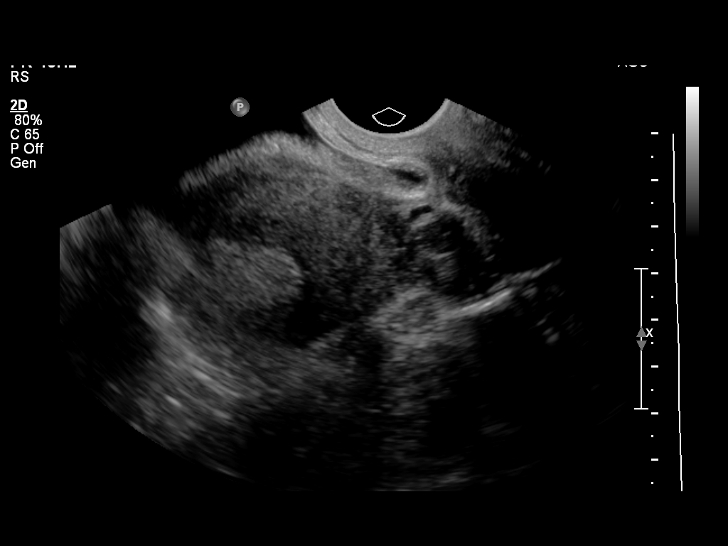
[im 10/29]
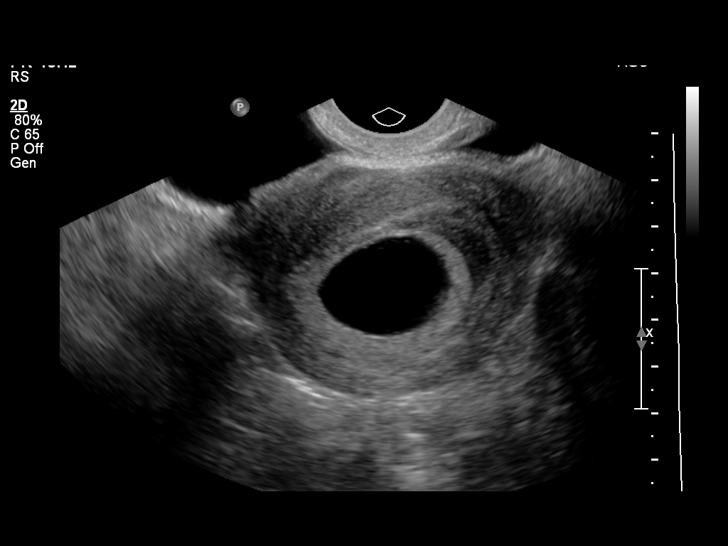
[im 12/29]
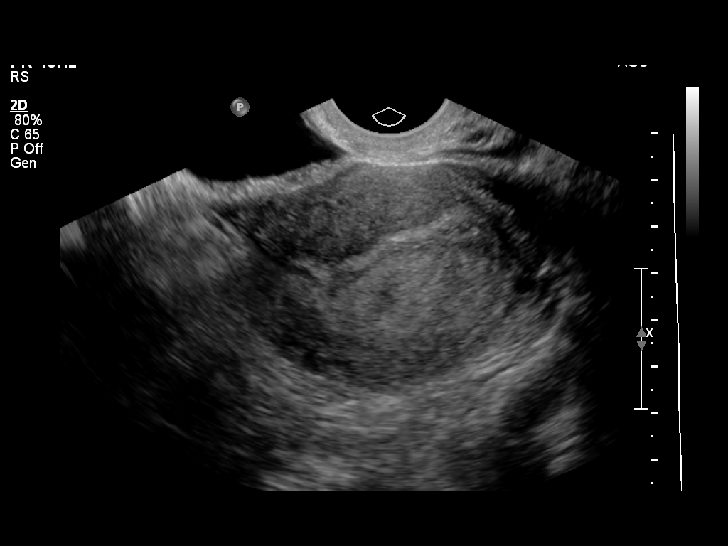
[im 14/29]
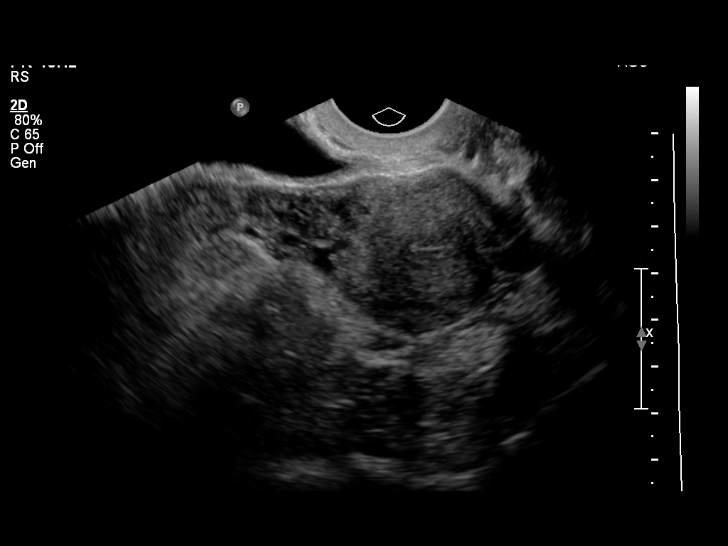
[im 16/29]
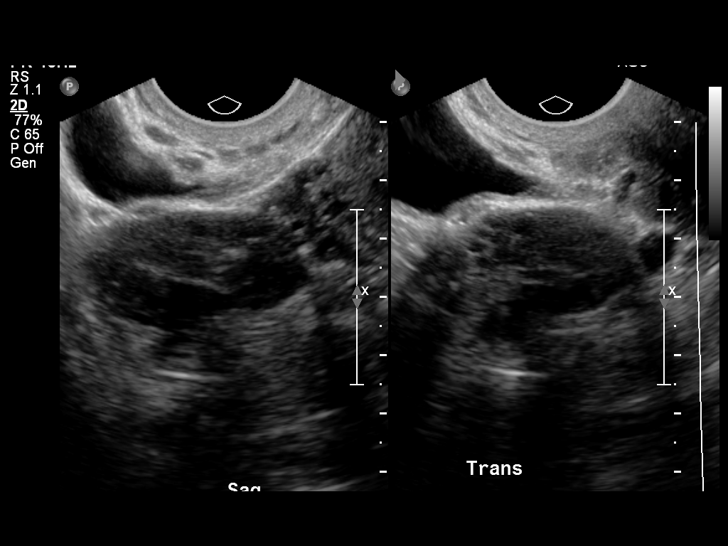
[im 18/29]
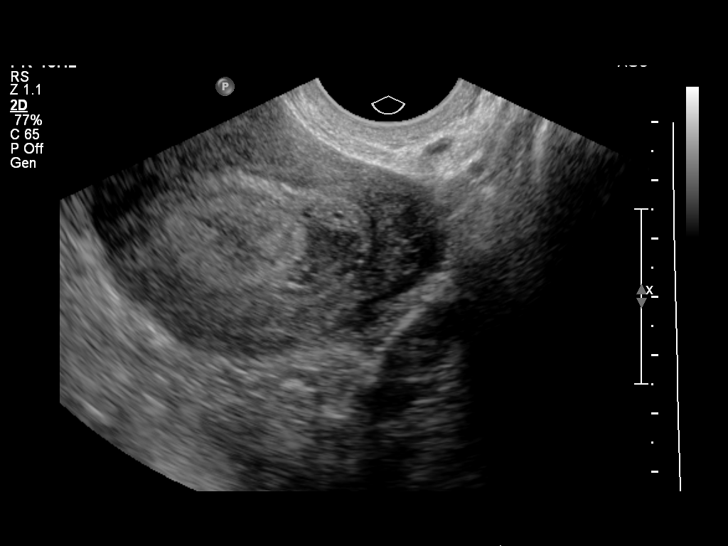
[im 20/29]
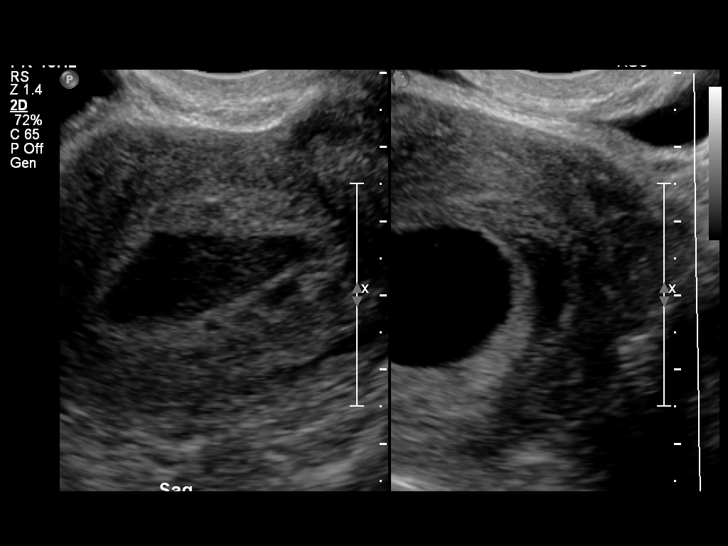
[im 22/29]
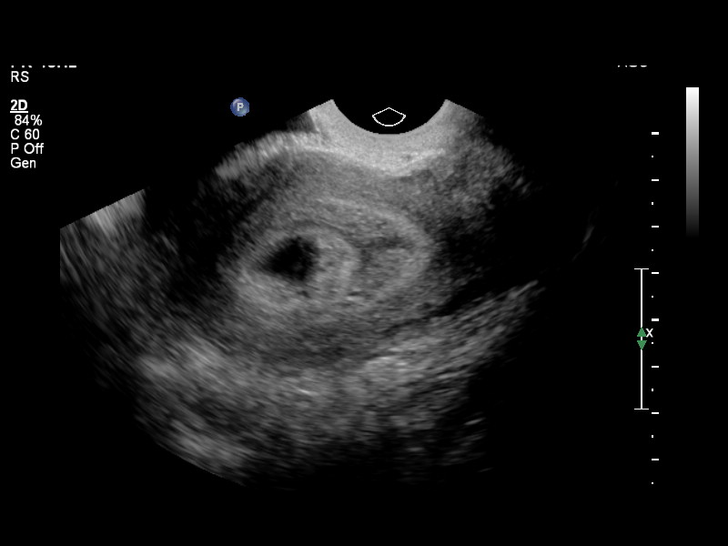
[im 24/29]
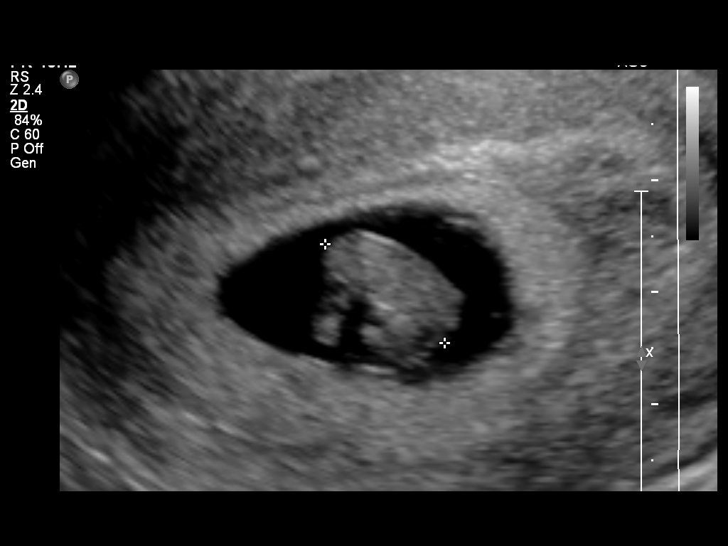
[im 26/29]
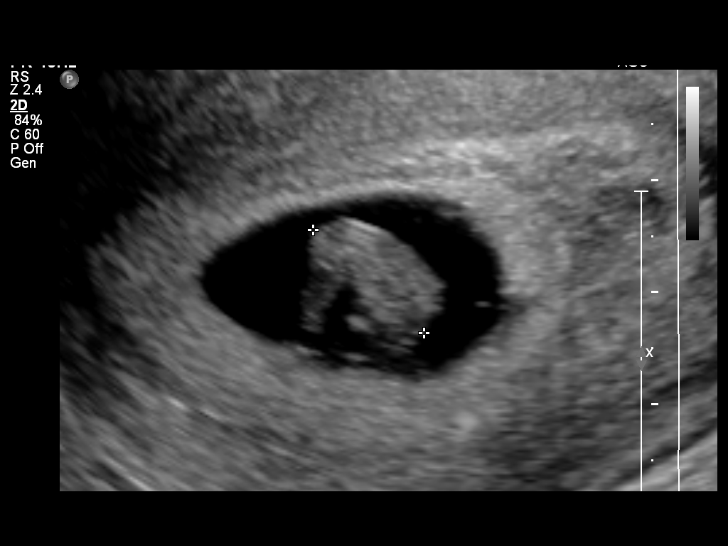
[im 29/29]
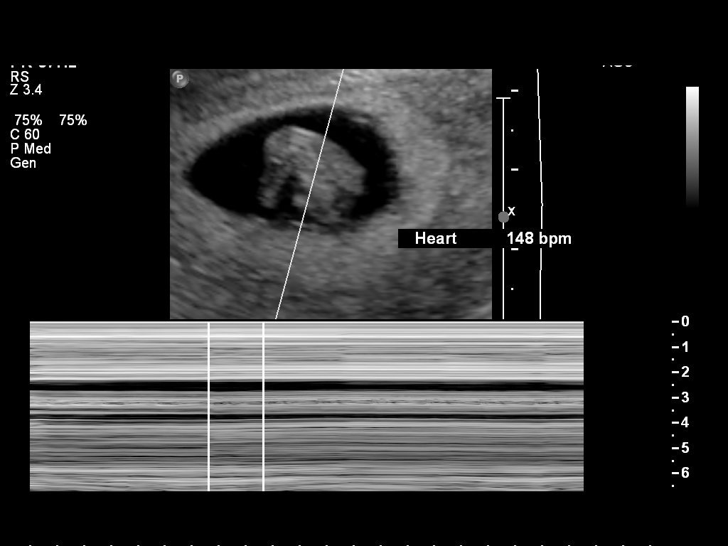

[14 of 28 positions shown; findings below may reference images not displayed]

FINDINGS: Intrauterine gestational sac: Visualized/normal in shape.

Yolk sac:  Present.

Embryo:  Present.

Cardiac Activity: Present.

Heart Rate: 148 bpm

CRL:   13.6  mm   7 w 5 d                  US EDC: 06/20/2015

Maternal uterus/adnexae: Moderate subchorionic hemorrhage measuring
3.0 x 1.2 x 0.5 cm. The right ovary is normal. The left ovary is not
definitively seen. No pelvic free fluid.
IMPRESSION: Single live intrauterine pregnancy estimated gestational age 7 weeks
5 days for estimated date of delivery 06/20/2015. Moderate amount of
subchorionic hemorrhage is seen.
